# Patient Record
Sex: Female | Born: 1965 | Race: White | Hispanic: No | Marital: Married | Smoking: Former smoker
Health system: Southern US, Community
[De-identification: ages and names within clinical notes are randomized; demographics above are authoritative.]

## PROBLEM LIST (undated history)

## (undated) DIAGNOSIS — M549 Dorsalgia, unspecified: Secondary | ICD-10-CM

## (undated) DIAGNOSIS — IMO0002 Reserved for concepts with insufficient information to code with codable children: Secondary | ICD-10-CM

## (undated) DIAGNOSIS — K910 Vomiting following gastrointestinal surgery: Secondary | ICD-10-CM

## (undated) DIAGNOSIS — M1990 Unspecified osteoarthritis, unspecified site: Secondary | ICD-10-CM

## (undated) DIAGNOSIS — J302 Other seasonal allergic rhinitis: Secondary | ICD-10-CM

## (undated) DIAGNOSIS — K2270 Barrett's esophagus without dysplasia: Secondary | ICD-10-CM

## (undated) DIAGNOSIS — K259 Gastric ulcer, unspecified as acute or chronic, without hemorrhage or perforation: Secondary | ICD-10-CM

## (undated) DIAGNOSIS — N390 Urinary tract infection, site not specified: Secondary | ICD-10-CM

## (undated) DIAGNOSIS — E039 Hypothyroidism, unspecified: Secondary | ICD-10-CM

## (undated) DIAGNOSIS — I10 Essential (primary) hypertension: Secondary | ICD-10-CM

## (undated) DIAGNOSIS — J189 Pneumonia, unspecified organism: Secondary | ICD-10-CM

## (undated) DIAGNOSIS — R51 Headache: Secondary | ICD-10-CM

## (undated) DIAGNOSIS — J40 Bronchitis, not specified as acute or chronic: Secondary | ICD-10-CM

## (undated) DIAGNOSIS — G4730 Sleep apnea, unspecified: Secondary | ICD-10-CM

## (undated) DIAGNOSIS — G8929 Other chronic pain: Secondary | ICD-10-CM

## (undated) HISTORY — DX: Reserved for concepts with insufficient information to code with codable children: IMO0002

## (undated) HISTORY — PX: COLONOSCOPY: SHX174

## (undated) HISTORY — PX: ESOPHAGOGASTRODUODENOSCOPY: SHX1529

---

## 1999-08-18 HISTORY — PX: TUBAL LIGATION: SHX77

## 2000-08-17 HISTORY — PX: CERVICAL FUSION: SHX112

## 2004-08-17 HISTORY — PX: OTHER SURGICAL HISTORY: SHX169

## 2007-08-18 HISTORY — PX: CHOLECYSTECTOMY: SHX55

## 2009-06-14 ENCOUNTER — Inpatient Hospital Stay (HOSPITAL_COMMUNITY): Admission: RE | Admit: 2009-06-14 | Discharge: 2009-06-19 | Payer: Self-pay | Admitting: Neurosurgery

## 2009-06-14 HISTORY — PX: LUMBAR LAMINECTOMY: SHX95

## 2009-07-17 ENCOUNTER — Encounter: Admission: RE | Admit: 2009-07-17 | Discharge: 2009-07-17 | Payer: Self-pay | Admitting: Neurosurgery

## 2009-08-17 DIAGNOSIS — J40 Bronchitis, not specified as acute or chronic: Secondary | ICD-10-CM

## 2009-08-17 HISTORY — DX: Bronchitis, not specified as acute or chronic: J40

## 2009-08-20 ENCOUNTER — Encounter: Admission: RE | Admit: 2009-08-20 | Discharge: 2009-08-20 | Payer: Self-pay | Admitting: Neurosurgery

## 2010-11-20 LAB — DIFFERENTIAL
Eosinophils Absolute: 0.3 10*3/uL (ref 0.0–0.7)
Eosinophils Relative: 3 % (ref 0–5)
Lymphocytes Relative: 23 % (ref 12–46)
Monocytes Absolute: 0.6 10*3/uL (ref 0.1–1.0)
Monocytes Relative: 8 % (ref 3–12)
Neutro Abs: 5.4 10*3/uL (ref 1.7–7.7)

## 2010-11-20 LAB — TYPE AND SCREEN
ABO/RH(D): A POS
Antibody Screen: NEGATIVE

## 2010-11-20 LAB — CBC
Platelets: 396 10*3/uL (ref 150–400)
RDW: 13.6 % (ref 11.5–15.5)

## 2010-11-20 LAB — URINALYSIS, ROUTINE W REFLEX MICROSCOPIC
Glucose, UA: NEGATIVE mg/dL
Hgb urine dipstick: NEGATIVE
Ketones, ur: NEGATIVE mg/dL
Specific Gravity, Urine: 1.01 (ref 1.005–1.030)

## 2010-11-20 LAB — BASIC METABOLIC PANEL
Calcium: 9.4 mg/dL (ref 8.4–10.5)
Creatinine, Ser: 0.86 mg/dL (ref 0.4–1.2)

## 2011-03-11 ENCOUNTER — Emergency Department (HOSPITAL_COMMUNITY)
Admission: EM | Admit: 2011-03-11 | Discharge: 2011-03-11 | Disposition: A | Discharge status: Home / Self Care | Payer: 59 | Attending: Emergency Medicine | Admitting: Emergency Medicine

## 2011-03-11 DIAGNOSIS — G2581 Restless legs syndrome: Secondary | ICD-10-CM | POA: Insufficient documentation

## 2011-03-11 DIAGNOSIS — T426X5A Adverse effect of other antiepileptic and sedative-hypnotic drugs, initial encounter: Secondary | ICD-10-CM | POA: Insufficient documentation

## 2011-03-11 DIAGNOSIS — E039 Hypothyroidism, unspecified: Secondary | ICD-10-CM | POA: Insufficient documentation

## 2011-03-11 DIAGNOSIS — F410 Panic disorder [episodic paroxysmal anxiety] without agoraphobia: Secondary | ICD-10-CM | POA: Insufficient documentation

## 2011-03-11 DIAGNOSIS — I10 Essential (primary) hypertension: Secondary | ICD-10-CM | POA: Insufficient documentation

## 2011-03-11 DIAGNOSIS — M79609 Pain in unspecified limb: Secondary | ICD-10-CM | POA: Insufficient documentation

## 2011-12-02 ENCOUNTER — Emergency Department (HOSPITAL_BASED_OUTPATIENT_CLINIC_OR_DEPARTMENT_OTHER)
Admission: EM | Admit: 2011-12-02 | Discharge: 2011-12-02 | Disposition: A | Discharge status: Home / Self Care | Payer: 59 | Attending: Emergency Medicine | Admitting: Emergency Medicine

## 2011-12-02 DIAGNOSIS — K922 Gastrointestinal hemorrhage, unspecified: Secondary | ICD-10-CM

## 2011-12-02 NOTE — ED Notes (Signed)
Pt reports rectal bleeding "for months", however amount of bleeding has increased significantly over the past two weeks. Pt has a GI MD that she was supposed to see a couple weeks ago, but was unable to attend that appt.

## 2011-12-02 NOTE — ED Notes (Signed)
Pt states she is a patient of Dr Lanae Boast in [place]. States she was told by her doctor "to come to [facility] and be seen for my bleeding because i cant get an appointment in with her sooner and i'm going out of town on Friday. So basically i need a colonoscopy and endoscopy and see if i have fissures and maybe get my hemorrhoids banded while im here too." pt made aware that our facility does not perform endoscopys or band hemorrhoids. Pt stated "oh, well i thought this was a hospital and that i could get all of that done. OK, well i think i should just go to [place] [facility] instead like my doctor said". Pt aware that our facility EDP would be happy to see her and we would do any necessary testing, however pt kindly refused and stated she would rather go to a hospital. Pt alert and oriented. No distress.

## 2011-12-02 NOTE — ED Notes (Signed)
Pt realized at the start of triage that she did not want to be seen. Triage process was not completed.

## 2011-12-24 ENCOUNTER — Encounter (HOSPITAL_COMMUNITY): Payer: Self-pay | Admitting: Pharmacy Technician

## 2011-12-25 ENCOUNTER — Other Ambulatory Visit: Payer: Self-pay | Admitting: Neurosurgery

## 2011-12-28 ENCOUNTER — Inpatient Hospital Stay (HOSPITAL_COMMUNITY): Admission: RE | Admit: 2011-12-28 | Discharge: 2011-12-28 | Payer: 59 | Source: Ambulatory Visit

## 2011-12-28 ENCOUNTER — Encounter (HOSPITAL_COMMUNITY): Payer: Self-pay

## 2011-12-28 HISTORY — DX: Hypothyroidism, unspecified: E03.9

## 2011-12-28 NOTE — Pre-Procedure Instructions (Signed)
21 Sharon Hughes  12/28/2011   Your procedure is scheduled on:  Monday, May 20th.  Report to [facility] at 5:30AM.  Call this number if you have problems the morning of surgery: [phone]   Remember:   Do not eat food:After Midnight.  May have clear liquids: up to 4 Hours before arrival.  (1:30am)  Clear liquids include soda, tea, black coffee, apple or grape juice, broth.   Take these medicines the morning of surgery with A SIP OF WATER:  Cimetidine (Tagamet), Levothyroxine (Synthyroid),  Methadone .  Use Albuterol and bring to the hospital with you.   Do not wear jewelry, make-up or nail polish.  Do not wear lotions, powders, or perfumes. You may wear deodorant.  Do not shave 48 hours prior to surgery. Men may shave face and neck.  Do not bring valuables to the hospital.  Contacts, dentures or bridgework may not be worn into surgery.  Leave suitcase in the car. After surgery it may be brought to your room.  For patients admitted to the hospital, checkout time is 11:00 AM the day of discharge.   Patients discharged the day of surgery will not be allowed to drive home.  Name and phone number of your driver: --  Special Instructions: CHG Shower Use Special Wash: 1/2 bottle night before surgery and 1/2 bottle morning of surgery.   Please read over the following fact sheets that you were given: Pain Booklet, Coughing and Deep Breathing, Blood Transfusion Information, MRSA Information and Surgical Site Infection Prevention

## 2011-12-30 ENCOUNTER — Ambulatory Visit (HOSPITAL_COMMUNITY)
Admission: RE | Admit: 2011-12-30 | Discharge: 2011-12-30 | Disposition: A | Discharge status: Home / Self Care | Payer: 59 | Source: Ambulatory Visit | Attending: Neurosurgery | Admitting: Neurosurgery

## 2011-12-30 ENCOUNTER — Encounter (HOSPITAL_COMMUNITY): Payer: Self-pay

## 2011-12-30 ENCOUNTER — Encounter (HOSPITAL_COMMUNITY)
Admission: RE | Admit: 2011-12-30 | Discharge: 2011-12-30 | Disposition: A | Discharge status: Home / Self Care | Payer: 59 | Source: Ambulatory Visit | Attending: Neurosurgery | Admitting: Neurosurgery

## 2011-12-30 DIAGNOSIS — Z01818 Encounter for other preprocedural examination: Secondary | ICD-10-CM | POA: Insufficient documentation

## 2011-12-30 DIAGNOSIS — I4510 Unspecified right bundle-branch block: Secondary | ICD-10-CM | POA: Insufficient documentation

## 2011-12-30 DIAGNOSIS — Z01812 Encounter for preprocedural laboratory examination: Secondary | ICD-10-CM | POA: Insufficient documentation

## 2011-12-30 DIAGNOSIS — Z01810 Encounter for preprocedural cardiovascular examination: Secondary | ICD-10-CM | POA: Insufficient documentation

## 2011-12-30 DIAGNOSIS — M48061 Spinal stenosis, lumbar region without neurogenic claudication: Secondary | ICD-10-CM | POA: Insufficient documentation

## 2011-12-30 HISTORY — DX: Dorsalgia, unspecified: M54.9

## 2011-12-30 HISTORY — DX: Reserved for concepts with insufficient information to code with codable children: IMO0002

## 2011-12-30 HISTORY — DX: Barrett's esophagus without dysplasia: K22.70

## 2011-12-30 HISTORY — DX: Headache: R51

## 2011-12-30 HISTORY — DX: Bronchitis, not specified as acute or chronic: J40

## 2011-12-30 HISTORY — DX: Essential (primary) hypertension: I10

## 2011-12-30 HISTORY — DX: Unspecified osteoarthritis, unspecified site: M19.90

## 2011-12-30 HISTORY — DX: Other seasonal allergic rhinitis: J30.2

## 2011-12-30 HISTORY — DX: Pneumonia, unspecified organism: J18.9

## 2011-12-30 HISTORY — DX: Urinary tract infection, site not specified: N39.0

## 2011-12-30 HISTORY — DX: Other chronic pain: G89.29

## 2011-12-30 LAB — CBC
HCT: 38.8 % (ref 36.0–46.0)
Hemoglobin: 12.7 g/dL (ref 12.0–15.0)
MCH: 31.8 pg (ref 26.0–34.0)
MCHC: 32.7 g/dL (ref 30.0–36.0)
MCV: 97 fL (ref 78.0–100.0)

## 2011-12-30 LAB — TYPE AND SCREEN

## 2011-12-30 LAB — BASIC METABOLIC PANEL
BUN: 13 mg/dL (ref 6–23)
CO2: 29 mEq/L (ref 19–32)
Chloride: 97 mEq/L (ref 96–112)
Glucose, Bld: 72 mg/dL (ref 70–99)
Potassium: 3.5 mEq/L (ref 3.5–5.1)

## 2011-12-30 LAB — DIFFERENTIAL
Basophils Relative: 0 % (ref 0–1)
Eosinophils Absolute: 0 10*3/uL (ref 0.0–0.7)
Lymphocytes Relative: 29 % (ref 12–46)
Lymphs Abs: 5 10*3/uL — ABNORMAL HIGH (ref 0.7–4.0)
Neutro Abs: 10.9 10*3/uL — ABNORMAL HIGH (ref 1.7–7.7)

## 2011-12-30 LAB — SURGICAL PCR SCREEN: Staphylococcus aureus: NEGATIVE

## 2011-12-30 NOTE — Progress Notes (Signed)
Pt doesn't have a cardiologist  Echo/Stress test done 5+yrs ago was done d/t palpitations  Denies ever having a heart cath  Medical MD is Dr.Kipp Corington with [facility] in [place]

## 2011-12-31 NOTE — Consult Note (Signed)
Anesthesia Chart Review:  Patient is a 46 year old female scheduled for L3-4 XLIF on 01/04/12.  History includes smoking, HTN, PNA, asthma, bronchitis, Barrett's esophagus, anxiety, hypothyroidism, chronic back pain, prior cervial fusion, lumbar laminectomy.  PCP is Dr. Denzil Magnuson with [facility] in [place].  EKG on 12/30/11 showed NSR, possible LAE, incomplete right BBB.  It was not felt significantly changed from her EKG from 06/14/09.    She had a negative CXR on 12/30/11.  Labs noted.  WBC 17.3.  No current steroids are listed on her PAT visit.  She was afebrile at PAT.  No UA was ordered, but CXR was OK.  Her WBC was called to Shanda Bumps at Dr. Lindalou Hose office.  I'll repeat her CBC on arrival.  Additional orders, if any, per Dr. Jordan Likes.  Shonna Chock, PA-C

## 2012-01-03 MED ORDER — CEFAZOLIN SODIUM-DEXTROSE 2-3 GM-% IV SOLR
2.0000 g | INTRAVENOUS | Status: AC
  Administered 2012-01-04: 2 g via INTRAVENOUS
  Filled 2012-01-03: qty 50

## 2012-01-03 MED ORDER — CEFAZOLIN SODIUM 1-5 GM-% IV SOLN: 1.0000 g | INTRAVENOUS | Status: DC

## 2012-01-04 ENCOUNTER — Encounter (HOSPITAL_COMMUNITY): Payer: Self-pay | Admitting: Neurosurgery

## 2012-01-04 ENCOUNTER — Ambulatory Visit (HOSPITAL_COMMUNITY): Payer: 59 | Admitting: Vascular Surgery

## 2012-01-04 ENCOUNTER — Encounter (HOSPITAL_COMMUNITY)
Admission: RE | Disposition: A | Discharge status: Home / Self Care | Payer: Self-pay | Source: Ambulatory Visit | Attending: Neurosurgery

## 2012-01-04 ENCOUNTER — Encounter (HOSPITAL_COMMUNITY): Payer: Self-pay | Admitting: Vascular Surgery

## 2012-01-04 ENCOUNTER — Encounter (HOSPITAL_COMMUNITY): Payer: Self-pay | Admitting: *Deleted

## 2012-01-04 ENCOUNTER — Inpatient Hospital Stay (HOSPITAL_COMMUNITY)
Admission: RE | Admit: 2012-01-04 | Discharge: 2012-01-05 | DRG: 460 | Disposition: A | Discharge status: Home / Self Care | Payer: 59 | Source: Ambulatory Visit | Attending: Neurosurgery | Admitting: Neurosurgery

## 2012-01-04 ENCOUNTER — Ambulatory Visit (HOSPITAL_COMMUNITY): Payer: 59

## 2012-01-04 DIAGNOSIS — J45909 Unspecified asthma, uncomplicated: Secondary | ICD-10-CM | POA: Diagnosis present

## 2012-01-04 DIAGNOSIS — E039 Hypothyroidism, unspecified: Secondary | ICD-10-CM | POA: Diagnosis present

## 2012-01-04 DIAGNOSIS — Z888 Allergy status to other drugs, medicaments and biological substances status: Secondary | ICD-10-CM

## 2012-01-04 DIAGNOSIS — M51379 Other intervertebral disc degeneration, lumbosacral region without mention of lumbar back pain or lower extremity pain: Principal | ICD-10-CM | POA: Diagnosis present

## 2012-01-04 DIAGNOSIS — K5900 Constipation, unspecified: Secondary | ICD-10-CM | POA: Diagnosis present

## 2012-01-04 DIAGNOSIS — M48062 Spinal stenosis, lumbar region with neurogenic claudication: Secondary | ICD-10-CM | POA: Diagnosis present

## 2012-01-04 DIAGNOSIS — Z79899 Other long term (current) drug therapy: Secondary | ICD-10-CM

## 2012-01-04 DIAGNOSIS — M5137 Other intervertebral disc degeneration, lumbosacral region: Principal | ICD-10-CM | POA: Diagnosis present

## 2012-01-04 DIAGNOSIS — Z885 Allergy status to narcotic agent status: Secondary | ICD-10-CM

## 2012-01-04 DIAGNOSIS — F411 Generalized anxiety disorder: Secondary | ICD-10-CM | POA: Diagnosis present

## 2012-01-04 DIAGNOSIS — Z9851 Tubal ligation status: Secondary | ICD-10-CM

## 2012-01-04 DIAGNOSIS — I10 Essential (primary) hypertension: Secondary | ICD-10-CM | POA: Diagnosis present

## 2012-01-04 DIAGNOSIS — G8929 Other chronic pain: Secondary | ICD-10-CM | POA: Diagnosis present

## 2012-01-04 DIAGNOSIS — M47817 Spondylosis without myelopathy or radiculopathy, lumbosacral region: Secondary | ICD-10-CM | POA: Diagnosis present

## 2012-01-04 HISTORY — PX: ANTERIOR LAT LUMBAR FUSION: SHX1168

## 2012-01-04 LAB — CBC
HCT: 42.5 % (ref 36.0–46.0)
Platelets: ADEQUATE 10*3/uL (ref 150–400)
RBC: 3.74 MIL/uL — ABNORMAL LOW (ref 3.87–5.11)
RDW: 20.6 % — ABNORMAL HIGH (ref 11.5–15.5)
WBC: 12 10*3/uL — ABNORMAL HIGH (ref 4.0–10.5)

## 2012-01-04 SURGERY — ANTERIOR LATERAL LUMBAR FUSION 1 LEVEL
Anesthesia: General | Site: Spine Lumbar | Laterality: Left | Wound class: Clean

## 2012-01-04 MED ORDER — BACITRACIN 50000 UNITS IM SOLR
INTRAMUSCULAR | Status: AC
  Filled 2012-01-04: qty 1

## 2012-01-04 MED ORDER — SENNA 8.6 MG PO TABS
1.0000 | ORAL_TABLET | Freq: Two times a day (BID) | ORAL | Status: DC
  Administered 2012-01-04 – 2012-01-05 (×2): 8.6 mg via ORAL
  Filled 2012-01-04 (×3): qty 1

## 2012-01-04 MED ORDER — LEVOTHYROXINE SODIUM 150 MCG PO TABS
150.0000 ug | ORAL_TABLET | Freq: Every day | ORAL | Status: DC
  Administered 2012-01-04 – 2012-01-05 (×2): 150 ug via ORAL
  Filled 2012-01-04 (×2): qty 1

## 2012-01-04 MED ORDER — POLYETHYLENE GLYCOL 3350 17 G PO PACK
17.0000 g | PACK | Freq: Every day | ORAL | Status: DC
  Administered 2012-01-05: 17 g via ORAL
  Filled 2012-01-04: qty 1

## 2012-01-04 MED ORDER — LISINOPRIL-HYDROCHLOROTHIAZIDE 20-12.5 MG PO TABS
1.0000 | ORAL_TABLET | Freq: Every day | ORAL | Status: DC
Start: 2012-01-04 — End: 2012-01-04

## 2012-01-04 MED ORDER — HYDROCHLOROTHIAZIDE 12.5 MG PO CAPS
12.5000 mg | ORAL_CAPSULE | Freq: Every day | ORAL | Status: DC
  Administered 2012-01-05: 12.5 mg via ORAL
  Filled 2012-01-04: qty 1

## 2012-01-04 MED ORDER — PHENOL 1.4 % MT LIQD: 1.0000 | OROMUCOSAL | Status: DC | PRN

## 2012-01-04 MED ORDER — SUCCINYLCHOLINE CHLORIDE 20 MG/ML IJ SOLN
INTRAMUSCULAR | Status: DC | PRN
  Administered 2012-01-04: 100 mg via INTRAVENOUS

## 2012-01-04 MED ORDER — DEXAMETHASONE SODIUM PHOSPHATE 10 MG/ML IJ SOLN: 10.0000 mg | INTRAMUSCULAR | Status: DC

## 2012-01-04 MED ORDER — ACETAMINOPHEN 325 MG PO TABS: 650.0000 mg | ORAL_TABLET | ORAL | Status: DC | PRN

## 2012-01-04 MED ORDER — SODIUM CHLORIDE 0.9 % IV SOLN
INTRAVENOUS | Status: AC
  Filled 2012-01-04: qty 500

## 2012-01-04 MED ORDER — LACTATED RINGERS IV SOLN
INTRAVENOUS | Status: DC | PRN
  Administered 2012-01-04 (×2): via INTRAVENOUS

## 2012-01-04 MED ORDER — CEFAZOLIN SODIUM 1-5 GM-% IV SOLN
1.0000 g | Freq: Three times a day (TID) | INTRAVENOUS | Status: AC
  Administered 2012-01-04 (×2): 1 g via INTRAVENOUS
  Filled 2012-01-04 (×2): qty 50

## 2012-01-04 MED ORDER — SODIUM CHLORIDE 0.9 % IJ SOLN: 3.0000 mL | INTRAMUSCULAR | Status: DC | PRN

## 2012-01-04 MED ORDER — HYDROMORPHONE HCL PF 1 MG/ML IJ SOLN
INTRAMUSCULAR | Status: AC
  Administered 2012-01-04: 0.5 mg
  Filled 2012-01-04: qty 1

## 2012-01-04 MED ORDER — FENTANYL CITRATE 0.05 MG/ML IJ SOLN
INTRAMUSCULAR | Status: DC | PRN
  Administered 2012-01-04: 50 ug via INTRAVENOUS
  Administered 2012-01-04: 150 ug via INTRAVENOUS
  Administered 2012-01-04: 50 ug via INTRAVENOUS

## 2012-01-04 MED ORDER — ALUM & MAG HYDROXIDE-SIMETH 200-200-20 MG/5ML PO SUSP: 30.0000 mL | Freq: Four times a day (QID) | ORAL | Status: DC | PRN

## 2012-01-04 MED ORDER — METOCLOPRAMIDE HCL 5 MG/ML IJ SOLN
10.0000 mg | Freq: Once | INTRAMUSCULAR | Status: DC | PRN
  Filled 2012-01-04: qty 2

## 2012-01-04 MED ORDER — LISINOPRIL 20 MG PO TABS
20.0000 mg | ORAL_TABLET | Freq: Every day | ORAL | Status: DC
  Administered 2012-01-05: 20 mg via ORAL
  Filled 2012-01-04: qty 1

## 2012-01-04 MED ORDER — ACETAMINOPHEN 650 MG RE SUPP: 650.0000 mg | RECTAL | Status: DC | PRN

## 2012-01-04 MED ORDER — BISACODYL 10 MG RE SUPP: 10.0000 mg | Freq: Every day | RECTAL | Status: DC | PRN

## 2012-01-04 MED ORDER — CYCLOBENZAPRINE HCL 10 MG PO TABS
10.0000 mg | ORAL_TABLET | Freq: Three times a day (TID) | ORAL | Status: DC | PRN
  Administered 2012-01-04: 10 mg via ORAL
  Filled 2012-01-04: qty 1

## 2012-01-04 MED ORDER — SODIUM CHLORIDE 0.9 % IJ SOLN
3.0000 mL | Freq: Two times a day (BID) | INTRAMUSCULAR | Status: DC
  Administered 2012-01-04 – 2012-01-05 (×2): 3 mL via INTRAVENOUS

## 2012-01-04 MED ORDER — ONDANSETRON HCL 4 MG/2ML IJ SOLN
INTRAMUSCULAR | Status: DC | PRN
  Administered 2012-01-04: 4 mg via INTRAVENOUS

## 2012-01-04 MED ORDER — HYDROCODONE-ACETAMINOPHEN 5-325 MG PO TABS: 1.0000 | ORAL_TABLET | ORAL | Status: DC | PRN

## 2012-01-04 MED ORDER — POLYETHYLENE GLYCOL 3350 17 G PO PACK
17.0000 g | PACK | Freq: Every day | ORAL | Status: DC | PRN
  Filled 2012-01-04: qty 1

## 2012-01-04 MED ORDER — ONDANSETRON HCL 4 MG/2ML IJ SOLN: 4.0000 mg | INTRAMUSCULAR | Status: DC | PRN

## 2012-01-04 MED ORDER — SODIUM CHLORIDE 0.9 % IR SOLN
Status: DC | PRN
  Administered 2012-01-04: 07:00:00

## 2012-01-04 MED ORDER — FLEET ENEMA 7-19 GM/118ML RE ENEM
1.0000 | ENEMA | Freq: Once | RECTAL | Status: AC | PRN
  Filled 2012-01-04: qty 1

## 2012-01-04 MED ORDER — ZOLPIDEM TARTRATE 5 MG PO TABS: 10.0000 mg | ORAL_TABLET | Freq: Every evening | ORAL | Status: DC | PRN

## 2012-01-04 MED ORDER — FAMOTIDINE 20 MG PO TABS
20.0000 mg | ORAL_TABLET | Freq: Two times a day (BID) | ORAL | Status: DC
  Administered 2012-01-04 – 2012-01-05 (×2): 20 mg via ORAL
  Filled 2012-01-04 (×3): qty 1

## 2012-01-04 MED ORDER — DEXAMETHASONE SODIUM PHOSPHATE 10 MG/ML IJ SOLN
INTRAMUSCULAR | Status: AC
  Filled 2012-01-04: qty 1

## 2012-01-04 MED ORDER — METHADONE HCL 10 MG/ML PO CONC: 220.0000 mg | Freq: Every day | ORAL | Status: DC

## 2012-01-04 MED ORDER — HYDROMORPHONE HCL PF 1 MG/ML IJ SOLN
0.5000 mg | INTRAMUSCULAR | Status: DC | PRN
  Administered 2012-01-04 – 2012-01-05 (×6): 1 mg via INTRAVENOUS
  Filled 2012-01-04 (×6): qty 1

## 2012-01-04 MED ORDER — DIAZEPAM 5 MG PO TABS
5.0000 mg | ORAL_TABLET | Freq: Four times a day (QID) | ORAL | Status: DC | PRN
  Administered 2012-01-04: 5 mg via ORAL
  Administered 2012-01-05: 10 mg via ORAL
  Filled 2012-01-04: qty 1
  Filled 2012-01-04: qty 2

## 2012-01-04 MED ORDER — METHADONE HCL 5 MG PO TABS: 220.0000 mg | ORAL_TABLET | Freq: Every day | ORAL | Status: DC

## 2012-01-04 MED ORDER — ALBUTEROL SULFATE HFA 108 (90 BASE) MCG/ACT IN AERS
2.0000 | INHALATION_SPRAY | RESPIRATORY_TRACT | Status: DC | PRN
  Filled 2012-01-04: qty 6.7

## 2012-01-04 MED ORDER — 0.9 % SODIUM CHLORIDE (POUR BTL) OPTIME
TOPICAL | Status: DC | PRN
  Administered 2012-01-04: 1000 mL

## 2012-01-04 MED ORDER — CLONAZEPAM 0.5 MG PO TABS
1.0000 mg | ORAL_TABLET | Freq: Two times a day (BID) | ORAL | Status: DC | PRN
  Administered 2012-01-04 (×2): 1 mg via ORAL
  Filled 2012-01-04 (×2): qty 2

## 2012-01-04 MED ORDER — MEPERIDINE HCL 25 MG/ML IJ SOLN: 6.2500 mg | INTRAMUSCULAR | Status: DC | PRN

## 2012-01-04 MED ORDER — MIDAZOLAM HCL 5 MG/5ML IJ SOLN
INTRAMUSCULAR | Status: DC | PRN
  Administered 2012-01-04: 2 mg via INTRAVENOUS

## 2012-01-04 MED ORDER — HYDROMORPHONE HCL PF 1 MG/ML IJ SOLN
0.2500 mg | INTRAMUSCULAR | Status: DC | PRN
  Administered 2012-01-04 (×3): 0.5 mg via INTRAVENOUS

## 2012-01-04 MED ORDER — PROPOFOL 10 MG/ML IV EMUL
INTRAVENOUS | Status: DC | PRN
  Administered 2012-01-04: 200 mg via INTRAVENOUS

## 2012-01-04 MED ORDER — LIDOCAINE HCL (CARDIAC) 20 MG/ML IV SOLN
INTRAVENOUS | Status: DC | PRN
  Administered 2012-01-04: 80 mg via INTRAVENOUS

## 2012-01-04 MED ORDER — OXYCODONE-ACETAMINOPHEN 5-325 MG PO TABS
1.0000 | ORAL_TABLET | ORAL | Status: DC | PRN
  Administered 2012-01-04 – 2012-01-05 (×5): 2 via ORAL
  Filled 2012-01-04 (×6): qty 2

## 2012-01-04 MED ORDER — HYDROMORPHONE HCL PF 1 MG/ML IJ SOLN
INTRAMUSCULAR | Status: AC
  Filled 2012-01-04: qty 1

## 2012-01-04 MED ORDER — MENTHOL 3 MG MT LOZG
1.0000 | LOZENGE | OROMUCOSAL | Status: DC | PRN
  Filled 2012-01-04: qty 9

## 2012-01-04 MED ORDER — PHENYLEPHRINE HCL 10 MG/ML IJ SOLN
INTRAMUSCULAR | Status: DC | PRN
  Administered 2012-01-04: 40 ug via INTRAVENOUS

## 2012-01-04 MED ORDER — DEXAMETHASONE SODIUM PHOSPHATE 4 MG/ML IJ SOLN
INTRAMUSCULAR | Status: DC | PRN
  Administered 2012-01-04: 10 mg via INTRAVENOUS

## 2012-01-04 SURGICAL SUPPLY — 59 items
BAG DECANTER FOR FLEXI CONT (MISCELLANEOUS) ×2 IMPLANT
BENZOIN TINCTURE PRP APPL 2/3 (GAUZE/BANDAGES/DRESSINGS) ×2 IMPLANT
BLADE SURG ROTATE 9660 (MISCELLANEOUS) IMPLANT
BOLT 5.5X45MM (Bolt) ×2 IMPLANT
BOLT XLP 5.5X50MM (Bolt) ×2 IMPLANT
BONE MATRIX OSTEOCEL PLUS 5CC (Bone Implant) ×2 IMPLANT
CLOTH BEACON ORANGE TIMEOUT ST (SAFETY) ×2 IMPLANT
CONT SPEC 4OZ CLIKSEAL STRL BL (MISCELLANEOUS) IMPLANT
COROENT XL WIDE LORDO 10X22X45 (Intraocular Lens) ×2 IMPLANT
COVER BACK TABLE 24X17X13 BIG (DRAPES) ×2 IMPLANT
DERMABOND ADVANCED (GAUZE/BANDAGES/DRESSINGS) ×3
DERMABOND ADVANCED .7 DNX12 (GAUZE/BANDAGES/DRESSINGS) ×3 IMPLANT
DRAPE C-ARM 42X72 X-RAY (DRAPES) ×2 IMPLANT
DRAPE C-ARMOR (DRAPES) ×2 IMPLANT
DRAPE LAPAROTOMY 100X72X124 (DRAPES) ×2 IMPLANT
DRAPE POUCH INSTRU U-SHP 10X18 (DRAPES) ×2 IMPLANT
DRAPE SURG 17X23 STRL (DRAPES) ×4 IMPLANT
ELECT REM PT RETURN 9FT ADLT (ELECTROSURGICAL) ×2
ELECTRODE REM PT RTRN 9FT ADLT (ELECTROSURGICAL) ×1 IMPLANT
GAUZE SPONGE 4X4 16PLY XRAY LF (GAUZE/BANDAGES/DRESSINGS) IMPLANT
GLOVE BIO SURGEON STRL SZ8 (GLOVE) ×2 IMPLANT
GLOVE BIOGEL PI IND STRL 6.5 (GLOVE) ×2 IMPLANT
GLOVE BIOGEL PI INDICATOR 6.5 (GLOVE) ×2
GLOVE ECLIPSE 8.5 STRL (GLOVE) ×2 IMPLANT
GLOVE EXAM NITRILE LRG STRL (GLOVE) IMPLANT
GLOVE EXAM NITRILE MD LF STRL (GLOVE) ×2 IMPLANT
GLOVE EXAM NITRILE XL STR (GLOVE) IMPLANT
GLOVE EXAM NITRILE XS STR PU (GLOVE) IMPLANT
GLOVE INDICATOR 7.0 STRL GRN (GLOVE) ×2 IMPLANT
GLOVE INDICATOR 8.5 STRL (GLOVE) ×2 IMPLANT
GLOVE OPTIFIT SS 6.5 STRL BRWN (GLOVE) ×2 IMPLANT
GOWN BRE IMP SLV AUR LG STRL (GOWN DISPOSABLE) ×2 IMPLANT
GOWN BRE IMP SLV AUR XL STRL (GOWN DISPOSABLE) ×4 IMPLANT
GOWN STRL REIN 2XL LVL4 (GOWN DISPOSABLE) IMPLANT
KIT BASIN OR (CUSTOM PROCEDURE TRAY) ×2 IMPLANT
KIT DILATOR XLIF 5 (KITS) ×1 IMPLANT
KIT MAXCESS (KITS) ×2 IMPLANT
KIT NEEDLE NVM5 EMG ELECT (KITS) ×1 IMPLANT
KIT NEEDLE NVM5 EMG ELECTRODE (KITS) ×1
KIT ROOM TURNOVER OR (KITS) ×2 IMPLANT
KIT XLIF (KITS) ×1
NEEDLE HYPO 22GX1.5 SAFETY (NEEDLE) ×2 IMPLANT
NS IRRIG 1000ML POUR BTL (IV SOLUTION) ×2 IMPLANT
NUT LOCK NUVASIVE (Orthopedic Implant) ×4 IMPLANT
PACK LAMINECTOMY NEURO (CUSTOM PROCEDURE TRAY) ×2 IMPLANT
PLATE NUVASIVE XLP SZ8 (Plate) ×2 IMPLANT
PUTTY 5ML ACTIFUSE ABX (Putty) ×2 IMPLANT
SPONGE GAUZE 4X4 12PLY (GAUZE/BANDAGES/DRESSINGS) ×2 IMPLANT
SPONGE LAP 4X18 X RAY DECT (DISPOSABLE) IMPLANT
STRIP CLOSURE SKIN 1/2X4 (GAUZE/BANDAGES/DRESSINGS) ×2 IMPLANT
SUT VIC AB 2-0 CT1 18 (SUTURE) ×4 IMPLANT
SUT VIC AB 3-0 SH 8-18 (SUTURE) ×4 IMPLANT
SYR 20ML ECCENTRIC (SYRINGE) ×2 IMPLANT
TAPE CLOTH 3X10 TAN LF (GAUZE/BANDAGES/DRESSINGS) ×6 IMPLANT
TAPE CLOTH SURG 4X10 WHT LF (GAUZE/BANDAGES/DRESSINGS) ×2 IMPLANT
TOWEL OR 17X24 6PK STRL BLUE (TOWEL DISPOSABLE) ×2 IMPLANT
TOWEL OR 17X26 10 PK STRL BLUE (TOWEL DISPOSABLE) ×2 IMPLANT
TRAY FOLEY CATH 14FRSI W/METER (CATHETERS) ×2 IMPLANT
WATER STERILE IRR 1000ML POUR (IV SOLUTION) ×2 IMPLANT

## 2012-01-04 NOTE — Progress Notes (Signed)
OT Cancellation Note  Orders received for OT consult, pt currently sleeping soundly after just receiving pain meds.  Will check back tomorrow in the am to complete OT evaluation.  Perrin Maltese, OTR/L Pager number [phone] 01/04/2012

## 2012-01-04 NOTE — Anesthesia Postprocedure Evaluation (Signed)
  Anesthesia Post-op Note  Patient: Sharon Hughes  Procedure(s) Performed: Procedure(s) (LRB): ANTERIOR LATERAL LUMBAR FUSION 1 LEVEL (Left)  Patient Location: PACU  Anesthesia Type: General  Level of Consciousness: awake, alert  and oriented  Airway and Oxygen Therapy: Patient Spontanous Breathing  Post-op Pain: mild  Post-op Assessment: Post-op Vital signs reviewed, Patient's Cardiovascular Status Stable, Respiratory Function Stable, Patent Airway, No signs of Nausea or vomiting and Pain level controlled  Post-op Vital Signs: Reviewed and stable  Complications: No apparent anesthesia complications

## 2012-01-04 NOTE — Anesthesia Procedure Notes (Signed)
Procedure Name: Intubation Date/Time: 01/04/2012 7:58 AM Performed by: Ellin Goodie Pre-anesthesia Checklist: Patient identified, Emergency Drugs available, Suction available, Patient being monitored and Timeout performed Patient Re-evaluated:Patient Re-evaluated prior to inductionOxygen Delivery Method: Circle system utilized Preoxygenation: Pre-oxygenation with 100% oxygen Intubation Type: IV induction Ventilation: Mask ventilation without difficulty Laryngoscope Size: Mac and 3 Grade View: Grade I Tube type: Oral Tube size: 7.5 mm Number of attempts: 1 Airway Equipment and Method: Stylet Placement Confirmation: ETT inserted through vocal cords under direct vision,  positive ETCO2 and breath sounds checked- equal and bilateral Secured at: 21 cm Tube secured with: Tape Dental Injury: Teeth and Oropharynx as per pre-operative assessment

## 2012-01-04 NOTE — OR Nursing (Signed)
NUVASIVE NEEDLE ELECTRODES APPLIED FOR NERVE MONITORING

## 2012-01-04 NOTE — Transfer of Care (Signed)
Immediate Anesthesia Transfer of Care Note  Patient: Sharon Hughes  Procedure(s) Performed: Procedure(s) (LRB): ANTERIOR LATERAL LUMBAR FUSION 1 LEVEL (Left)  Patient Location: PACU  Anesthesia Type: General  Level of Consciousness: awake, alert  and oriented  Airway & Oxygen Therapy: Patient Spontanous Breathing  Post-op Assessment: Report given to PACU RN  Post vital signs: stable  Complications: No apparent anesthesia complications

## 2012-01-04 NOTE — Op Note (Signed)
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  Surgeon:Jonette Wassel A.Jerran Tappan, M.D.  Asst. Surgeon: Wynetta Emery  Anesthesia: General  Indication: Patient is a 46 year old female status post previous L4-5 decompression and fusion with good results. Patient presents now with worsening back and right lower extremity pain failing conservative management her workup demonstrates evidence of marked disc degeneration with instability at L3-4. Fusion appears solid at L4-5. Patient presents now for anterolateral decompression infusion in hopes of improving her symptoms.  Operative note: After induction of anesthesia. Patient positioned in the right lateral decubitus position and appropriately padded. Left flank was prepped and draped sterilely. X-ray was used for localization and incision was made overlying the L3-4 interspace. A secondary incision was made in the left posterior flank and access to the retroperitoneal space was gained in a blunt manner. A dilator was then passed through the more anterior incision and passed into the retroperitoneal space and told locking with the psoas muscle. Psoas muscle was divided and the dilator was.into the L3-4 disc space under fluoroscopic guidance. Intraoperative neural monitoring was used to confirm safe passage anterior to the lumbar plexus. The dilator was further expanded and a self-retaining retractor was placed. Once again all steps were performed using real-time neural monitoring. Retractor was expanded and the disc space visualized. The rec stimulation of the disc space and lateral  vertebral bodies of L3 and L4 was performed. Once again it was confirmed that there were no traversing nerve structures. I shim was then placed into the posterior disc space of L3-4. Aggressive discectomy was then performed using 15 blade and various curettes and gouges. A contralateral least was performed. The space was subsequently sized to a 10 mm lordotic graft. A 10 mm x 45 mm x 22 mm lordotic peek cage packed with morselized allograft was then impacted in place under fluoroscopic guidance. Good position was confirmed in both the lateral and AP plane. Lateral plate instrumentation was then placed under fluoroscopic guidance using bicortical single screws. Once the once the bolts were placed. A 8 mm plate was then placed over the bolt heads. Locking caps were then placed over the bolt heads and then tightened securely. Final images revealed good position the bone graft and hardware at the proper operative level with normal alignment of the spine. The wounds were then irrigated with and bike solution. They were closed in a typical fashion. Steri-Strips and sterile dressing were applied. There were no apparent complications.

## 2012-01-04 NOTE — Anesthesia Preprocedure Evaluation (Addendum)
Anesthesia Evaluation  Patient identified by MRN, date of birth, ID band Patient awake    Reviewed: Allergy & Precautions, H&P , NPO status , Patient's Chart, lab work & pertinent test results, reviewed documented beta blocker date and time   Airway Mallampati: I TM Distance: <3 FB     Dental  (+) Teeth Intact   Pulmonary asthma , pneumonia ,  breath sounds clear to auscultation        Cardiovascular hypertension, Pt. on medications Rhythm:Regular Rate:Normal     Neuro/Psych  Headaches, Anxiety    GI/Hepatic negative GI ROS, Neg liver ROS,   Endo/Other  Hypothyroidism   Renal/GU negative Renal ROS  negative genitourinary   Musculoskeletal negative musculoskeletal ROS (+)   Abdominal (+)  Abdomen: soft. Bowel sounds: normal.  Peds  Hematology negative hematology ROS (+)   Anesthesia Other Findings   Reproductive/Obstetrics negative OB ROS                         Anesthesia Physical Anesthesia Plan  ASA: II  Anesthesia Plan: General   Post-op Pain Management:    Induction: Intravenous  Airway Management Planned: Oral ETT  Additional Equipment:   Intra-op Plan:   Post-operative Plan: Extubation in OR  Informed Consent: I have reviewed the patients History and Physical, chart, labs and discussed the procedure including the risks, benefits and alternatives for the proposed anesthesia with the patient or authorized representative who has indicated his/her understanding and acceptance.   Dental advisory given  Plan Discussed with: CRNA, Anesthesiologist and Surgeon  Anesthesia Plan Comments:         Anesthesia Quick Evaluation

## 2012-01-04 NOTE — Progress Notes (Signed)
UR COMPLETED  

## 2012-01-04 NOTE — H&P (Signed)
Sharon Hughes is an 46 y.o. female.   Chief Complaint: Back pain HPI: [name] with severe back pain with some radiation into her right lower extremity. Patient status post previous L4-5 decompression and fusion. Patient with marked disc space breakdown and evidence of instability at the L3-4 level without evidence of significant stenosis. Patient presents now for L3-4 anterior lateral retroperitoneal fusion in hopes of improving her symptoms.  Past Medical History  Diagnosis Date  . Hypertension     takes Lisinopril prn  . Asthma     Advair daily and Albuterol prn  . Pneumonia     hx of--2009  . Bronchitis 2011  . Seasonal allergies   . Headache     occasionally  . Chronic back pain     degenerative disc and stenosis  . Arthritis   . Eczema     hx of--no problems in 28yrs   . Barrett's esophagus     takes Tagamet daily  . Ulcer     unsure if gastric/peptic/duodenal but cleared up with carafate  . 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 Hemorrhoids     banded  . UTI (lower urinary tract infection)     finished up antibiotics last week   . Hypothyroidism     takes Levothyroxine daily  . Anxiety     takes Klonopin prn    Past Surgical History  Procedure Date  . Lumbar laminectomy 06/14/2009  . Cervical fusion   . Cholecystectomy   . Cesarean section     x 3  . Tubal ligation     with one of the c/s  . Tubal reversed   . Esophagogastroduodenoscopy   . Colonoscopy     Family History  Problem Relation Age of Onset  . Anesthesia problems Neg Hx   . Hypotension Neg Hx   . Malignant hyperthermia Neg Hx   . Pseudochol deficiency Neg Hx    Social History:  reports that she has been smoking Cigarettes.  She has a .75 pack-year smoking history. She does not have any smokeless tobacco history on file. She reports that she does not drink alcohol or use illicit drugs.  Allergies:  Allergies  Allergen Reactions  . Ativan  (Lorazepam) Other (See Comments)    Aggravates restless leg syndrome  . Other Other (See Comments)    Melons cause bumps and swelling of the throat  . Toradol (Ketorolac Tromethamine) Other (See Comments)    Aggravates restless leg syndrome  . Vistaril (Hydroxyzine Hcl) Other (See Comments)    Aggravates restless leg syndrome    Medications Prior to Admission  Medication Sig Dispense Refill  . albuterol (PROVENTIL HFA;VENTOLIN HFA) 108 (90 BASE) MCG/ACT inhaler Inhale 2 puffs into the lungs every 4 (four) hours as needed. For shortness of breath      . Cimetidine (TAGAMET PO) Take 1 tablet by mouth daily as needed. For stomach acid      . clonazePAM (KLONOPIN) 1 MG tablet Take 1 mg by mouth 2 (two) times daily as needed.      Marland Kitchen levothyroxine (SYNTHROID, LEVOTHROID) 150 MCG tablet Take 150 mcg by mouth daily.      Marland Kitchen lisinopril-hydrochlorothiazide (PRINZIDE,ZESTORETIC) 20-12.5 MG per tablet Take 1 tablet by mouth daily.      . methadone (DOLOPHINE) 10 MG/ML solution Take 220 mg by mouth daily.      . polyethylene glycol (MIRALAX / GLYCOLAX) packet Take 17 g by  mouth daily.        Results for orders placed during the hospital encounter of 01/04/12 (from the past 48 hour(s))  CBC     Status: Abnormal   Collection Time   01/04/12  6:40 AM      Component Value Range Comment   WBC 12.0 (*) 4.0 - 10.5 (K/uL)    RBC 3.74 (*) 3.87 - 5.11 (MIL/uL)    Hemoglobin 12.2  12.0 - 15.0 (g/dL)    HCT 96.0  45.4 - 09.8 (%)    MCV 113.6 (*) 78.0 - 100.0 (fL)    MCH 32.6  26.0 - 34.0 (pg)    MCHC 28.7 (*) 30.0 - 36.0 (g/dL)    RDW 11.9 (*) 14.7 - 15.5 (%)    Platelets    150 - 400 (K/uL)    Value: PLATELET CLUMPS NOTED ON SMEAR, COUNT APPEARS ADEQUATE   No results found.  Review of Systems  Constitutional: Negative.   HENT: Negative.   Eyes: Negative.   Respiratory: Negative.   Cardiovascular: Negative.   Gastrointestinal: Negative.   Genitourinary: Negative.   Musculoskeletal: Negative.     Skin: Negative.   Neurological: Negative.   Endo/Heme/Allergies: Negative.   Psychiatric/Behavioral: Negative.     Blood pressure 101/66, pulse 98, temperature 98.7 F (37.1 C), temperature source Oral, resp. rate 20, last menstrual period 10/02/2011, SpO2 97.00%. Physical Exam  Constitutional: She is oriented to person, place, and time. She appears well-developed and well-nourished.  HENT:  Head: Normocephalic and atraumatic.  Right Ear: External ear normal.  Left Ear: External ear normal.  Nose: Nose normal.  Mouth/Throat: Oropharynx is clear and moist.  Eyes: Conjunctivae and EOM are normal. Pupils are equal, round, and reactive to light.  Neck: Normal range of motion. Neck supple. No tracheal deviation present. No thyromegaly present.  Cardiovascular: Normal rate, regular rhythm, normal heart sounds and intact distal pulses.   Respiratory: Effort normal and breath sounds normal. No respiratory distress. She has no wheezes.  GI: Soft. Bowel sounds are normal. She exhibits no distension. There is no tenderness.  Musculoskeletal: Normal range of motion. She exhibits no edema and no tenderness.  Neurological: She is alert and oriented to person, place, and time. She has normal reflexes. No cranial nerve deficit. Coordination normal.  Skin: Skin is warm and dry.  Psychiatric: She has a normal mood and affect. Her behavior is normal. Judgment and thought content normal.     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 Status post previous L4-5 decompression and fusion. Plan left L3-4 retroperitoneal anterolateral interbody decompression and fusion with peek interbody cage morselized allograft and lateral plate augmentation. Risks and benefits of explained. Patient wishes to proceed.  Sharon Hughes A 01/04/2012, 7:44 AM

## 2012-01-04 NOTE — Discharge Instructions (Signed)
Wound Care Keep incision covered and dry for one week.  If you shower prior to then, cover incision with plastic wrap.  You may remove outer bandage after one week and shower.  Do not put any creams, lotions, or ointments on incision. Leave steri-strips on back.  They will fall off by themselves. Activity Walk each and every day, increasing distance each day. No lifting greater than 5 lbs.  Avoid excessive neck motion. No driving for 2 weeks; may ride as a passenger locally. If provided with back brace, wear when out of bed.  It is not necessary to wear brace in bed. Diet Resume your normal diet.  Return to Work Will be discussed at you follow up appointment. Call Your Doctor If Any of These Occur Redness, drainage, or swelling at the wound.  Temperature greater than 101 degrees. Severe pain not relieved by pain medication. Incision starts to come apart. Follow Up Appt Call today for appointment in 1-2 weeks ([phone]) or for problems.  If you have any hardware placed in your spine, you will need an x-ray before your appointment. 

## 2012-01-04 NOTE — Preoperative (Signed)
Beta Blockers   Reason not to administer Beta Blockers:Not Applicable 

## 2012-01-04 NOTE — Brief Op Note (Signed)
01/04/2012  9:47 AM  PATIENT:  Sharon Hughes  46 y.o. female  PRE-OPERATIVE DIAGNOSIS:  stenosis  POST-OPERATIVE DIAGNOSIS:  stenosis  PROCEDURE:  Procedure(s) (LRB): ANTERIOR LATERAL LUMBAR FUSION 1 LEVEL (Left)  SURGEON:  Surgeon(s) and Role:    * [name], MD - Primary    * Mariam Dollar, MD - Assisting  PHYSICIAN ASSISTANT:   ASSISTANTS: Cram   ANESTHESIA:   general  EBL:  Total I/O In: 1600 [I.V.:1600] Out: 200 [Urine:150; Blood:50]  BLOOD ADMINISTERED:none  DRAINS: none   LOCAL MEDICATIONS USED:  MARCAINE     SPECIMEN:  No Specimen  DISPOSITION OF SPECIMEN:  N/A  COUNTS:  YES  TOURNIQUET:  * No tourniquets in log *  DICTATION: .Dragon Dictation  PLAN OF CARE: Admit to inpatient   PATIENT DISPOSITION:  PACU - hemodynamically stable.   Delay start of Pharmacological VTE agent (>24hrs) due to surgical blood loss or risk of bleeding: yes

## 2012-01-05 ENCOUNTER — Encounter (HOSPITAL_COMMUNITY): Payer: Self-pay | Admitting: Neurosurgery

## 2012-01-05 MED ORDER — DIAZEPAM 5 MG PO TABS: 5.0000 mg | ORAL_TABLET | Freq: Four times a day (QID) | ORAL | Status: AC | PRN

## 2012-01-05 MED ORDER — HYDROMORPHONE HCL 2 MG PO TABS: 2.0000 mg | ORAL_TABLET | ORAL | Status: AC | PRN

## 2012-01-05 NOTE — Evaluation (Signed)
Physical Therapy Evaluation Patient Details Name: Sharon Hughes MRN: [id number] DOB: 01-26-1966 Today's Date: 01/05/2012 Time: 4098-1191 PT Time Calculation (min): 25 min  PT Assessment / Plan / Recommendation Clinical Impression  Pt s/p decompression and fusion of 1 level, with prior history of the same at different level 2 years ago. At time of eval, pt present with decreased activity tolerance and acute pain, needing safety cues for back precautions and RW management. Pt will do well at home with 24hr assist from spouse. Reviewed back precautions and reinforced handout. Pt will need wheel attachment for her standard RW for safe DC home. No follow-up needed at this point. PT to follow acutely to continue safe mobility training, gait training with RW, and reinforce back precations.     PT Assessment  Patient needs continued PT services    Follow Up Recommendations  No PT follow up;Supervision/Assistance - 24 hour    Barriers to Discharge None      lEquipment Recommendations  Other (comment) (RW wheel attachment, or RW if unable to acquire wheels only)    Recommendations for Other Services   None  Frequency Min 5X/week    Precautions / Restrictions Precautions Precautions: Back Precaution Booklet Issued:  (OT gave this morning) Precaution Comments: Pt needs cues for recall and following back precatuions Required Braces or Orthoses: Spinal Brace Spinal Brace: Lumbar corset Restrictions Weight Bearing Restrictions: No   Pertinent Vitals/Pain Pt given pain meds and somewhat groggy at eval.       Mobility  Bed Mobility Bed Mobility: Right Sidelying to Sit;Sit to Sidelying Right Right Sidelying to Sit: 5: Supervision;HOB flat Sit to Sidelying Right: 4: Min guard;HOB flat Details for Bed Mobility Assistance: Cues for timing and pillow between knees sleeping on side Transfers Transfers: Sit to Stand;Stand to Sit Sit to Stand: 4: Min guard;With upper extremity assist;From  bed;From toilet Stand to Sit: 4: Min guard;With upper extremity assist;To bed;To toilet Details for Transfer Assistance: Pt needing cues for safe hand placement surface<>RW as well as keeping RW with pt during turns as she tends to leave it behind Ambulation/Gait Ambulation/Gait Assistance: 5: Supervision Ambulation Distance (Feet): 50 Feet (Pt was recieved while up walking in hall) Assistive device: Rolling walker Ambulation/Gait Assistance Details: Cues for keeping RW on ground during turn and for upright posture.  Gait Pattern: Within Functional Limits Stairs: No Wheelchair Mobility Wheelchair Mobility: No         PT Diagnosis: Difficulty walking;Generalized weakness;Acute pain  PT Problem List: Decreased strength;Decreased activity tolerance;Decreased balance;Decreased mobility;Decreased knowledge of use of DME;Decreased safety awareness;Decreased knowledge of precautions;Decreased skin integrity;Pain PT Treatment Interventions: DME instruction;Gait training;Stair training;Functional mobility training;Therapeutic activities;Therapeutic exercise;Balance training;Neuromuscular re-education;Patient/family education   PT Goals Acute Rehab PT Goals PT Goal Formulation: With patient/family Time For Goal Achievement: 01/12/12 Potential to Achieve Goals: Good Pt will Roll Supine to Right Side: with modified independence PT Goal: Rolling Supine to Right Side - Progress: Goal set today Pt will go Supine/Side to Sit: with modified independence;with HOB 0 degrees PT Goal: Supine/Side to Sit - Progress: Goal set today Pt will go Sit to Supine/Side: with modified independence;with HOB 0 degrees PT Goal: Sit to Supine/Side - Progress: Goal set today Pt will go Sit to Stand: with supervision;with upper extremity assist PT Goal: Sit to Stand - Progress: Goal set today Pt will go Stand to Sit: with supervision;with upper extremity assist PT Goal: Stand to Sit - Progress: Goal set today Pt will  Ambulate: >150 feet;with modified independence;with rolling  walker PT Goal: Ambulate - Progress: Goal set today Additional Goals Additional Goal #1: Pt will verablize and adhere to 3/3 back precautions without cues PT Goal: Additional Goal #1 - Progress: Goal set today  Visit Information  Last PT Received On: 01/05/12 Assistance Needed: +1    Subjective Data  Subjective: This is much better than last time Patient Stated Goal: Go home with spouse   Prior Functioning  Home Living Lives With: Spouse Available Help at Discharge: Available 24 hours/day Type of Home: House Home Access: Stairs to enter [employer] of Steps: 4 steps,     However there is a level entrance they might use as well if needed. Entrance Stairs-Rails: None Home Layout: Two level;Full bath on main level;Able to live on main level with bedroom/bathroom Bathroom Shower/Tub: Health visitor: Standard Bathroom Accessibility: Yes Home Adaptive Equipment: Environmental consultant - standard Prior Function Level of Independence: Independent Able to Take Stairs?: Yes Driving: Yes Communication Communication: No difficulties Dominant Hand: Right    Cognition  Overall Cognitive Status: Appears within functional limits for tasks assessed/performed Arousal/Alertness: Other (comment) (Pt groggy from pain meds, eyes closing during eval) Orientation Level: Appears intact for tasks assessed Behavior During Session: [facility] for tasks performed    Extremity/Trunk Assessment Right Upper Extremity Assessment RUE ROM/Strength/Tone: Unable to fully assess;Within functional levels;Due to precautions [facility] for AROM, strength not tested secondary to precautions.) RUE Sensation: WFL - Light Touch RUE Coordination: WFL - gross/fine motor Left Upper Extremity Assessment LUE ROM/Strength/Tone: Within functional levels;Unable to fully assess;Due to precautions LUE Sensation: WFL - Light Touch LUE Coordination: WFL - gross/fine  motor Right Lower Extremity Assessment RLE ROM/Strength/Tone: Unable to fully assess;Due to precautions;WFL for tasks assessed RLE Sensation: WFL - Light Touch RLE Coordination: WFL - gross/fine motor Left Lower Extremity Assessment LLE ROM/Strength/Tone: Unable to fully assess;Due to precautions;Within functional levels LLE Sensation: WFL - Light Touch LLE Coordination: WFL - gross/fine motor Trunk Assessment Trunk Assessment: Normal   Balance Balance Balance Assessed: Yes Dynamic Sitting Balance Dynamic Sitting - Balance Support: No upper extremity supported;Feet supported Dynamic Sitting - Level of Assistance: 5: Stand by assistance Dynamic Sitting - Balance Activities: Reaching for objects Static Standing Balance Static Standing - Balance Support: No upper extremity supported;During functional activity;Left upper extremity supported;Right upper extremity supported Static Standing - Level of Assistance: 5: Stand by assistance Dynamic Standing Balance Dynamic Standing - Balance Support: Bilateral upper extremity supported Dynamic Standing - Level of Assistance: 5: Stand by assistance  End of Session PT - End of Session Equipment Utilized During Treatment: Gait belt;Back brace Activity Tolerance: Patient tolerated treatment well;Patient limited by fatigue Patient left: in bed;with call bell/phone within reach;with family/visitor present Nurse Communication: Mobility status   Virl Cagey, PT 01/05/2012, 10:29 AM

## 2012-01-05 NOTE — Evaluation (Signed)
Occupational Therapy Evaluation Patient Details Name: Sharon Hughes MRN: [id number] DOB: [birth year] Today's Date: 01/05/2012 Time: 4098-1191 OT Time Calculation (min): 29 min  OT Assessment / Plan / Recommendation Clinical Impression  Pleasant 46 yr old female admaiited for lumbar fusion and decompression of 1 level.  Currently presents with increased pain and decreased overall independence with basic selfcare tasks.  Feel she will benefit from acute OT to help increase knowledge of ADL function while adhering to back precautions.  Feel she will be able to reach supervision level for discharge home with husband who can provide initial 24 hour supervision.      OT Assessment  Patient needs continued OT Services    Follow Up Recommendations  No OT follow up       Equipment Recommendations  3 in 1 bedside comode (Pt has SW but not RW, nay need wheel attachments.)       Frequency  Min 2X/week    Precautions / Restrictions Precautions Precautions: Back Required Braces or Orthoses: Spinal Brace Spinal Brace: Lumbar corset Restrictions Weight Bearing Restrictions: No       ADL  Eating/Feeding: Performed;Independent Where Assessed - Eating/Feeding: Chair Grooming: Performed;Supervision/safety Where Assessed - Grooming: Supported standing Upper Body Bathing: Simulated;Set up Where Assessed - Upper Body Bathing: Supported sitting Lower Body Bathing: Simulated;Minimal assistance Where Assessed - Lower Body Bathing: Supported sit to stand Upper Body Dressing: Simulated;Set up (Pt reports donning lumbar corsett without assist) Where Assessed - Upper Body Dressing: Unsupported sitting Lower Body Dressing: Performed;Supervision/safety (Using sockaide and reacher) Where Assessed - Lower Body Dressing: Unsupported sit to stand Toilet Transfer: Simulated;Min guard (Pt used walker to push up on for sit to stand.) Toilet Transfer Equipment: Regular height toilet Toileting - Clothing  Manipulation and Hygiene: Simulated;Min guard Where Assessed - Toileting Clothing Manipulation and Hygiene: Sit to stand from 3-in-1 or toilet Tub/Shower Transfer Method: Not assessed Equipment Used: Back brace;Rolling walker Transfers/Ambulation Related to ADLs: Pt min guard for mobility.  Demonstrates slight increased trunk/lumbar flexion in standing.  Needs walker to help with back support and to stand more upright. ADL Comments: Pt and husband educated on AE for LB selfcare as well as back precautions.  Pt very sleepy this am and at times would close eyes.  Very apologetic for this but feel she will benefit from 1 more visit prior to discharge.  Will need 3:1 for shower and toileting.    OT Diagnosis: Generalized weakness;Acute pain  OT Problem List: Decreased strength;Decreased activity tolerance;Pain;Decreased knowledge of precautions;Decreased knowledge of use of DME or AE OT Treatment Interventions: Self-care/ADL training;Therapeutic exercise;DME and/or AE instruction;Balance training;Patient/family education;Therapeutic activities   OT Goals Acute Rehab OT Goals OT Goal Formulation: With patient/family ADL Goals Pt Will Transfer to Toilet: with modified independence;with DME;3-in-1;Maintaining back safety precautions ADL Goal: Toilet Transfer - Progress: Goal set today Pt Will Perform Tub/Shower Transfer: Shower transfer;with supervision;with DME;Maintaining back safety precautions ADL Goal: Tub/Shower Transfer - Progress: Goal set today Additional ADL Goal #1: Pt will verbalize 3/3 back precautions independently. ADL Goal: Additional Goal #1 - Progress: Goal set today  Visit Information  Last OT Received On: 01/05/12    Subjective Data  Subjective: I'm so sleepy this morning Patient Stated Goal: To be pain free.   Prior Functioning  Home Living Lives With: Spouse Available Help at Discharge: Available 24 hours/day (Husband initally until Sunday) Type of Home: House Home  Access: Stairs to enter [employer] of Steps: 4 steps,     However there  is a level entrance they might use as well if needed. Entrance Stairs-Rails: None Home Layout: Two level;Full bath on main level;Able to live on main level with bedroom/bathroom Bathroom Shower/Tub: Health visitor: Standard Bathroom Accessibility: Yes Home Adaptive Equipment: Environmental consultant - standard Prior Function Level of Independence: Independent Able to Take Stairs?: Yes Driving: Yes Communication Communication: No difficulties Dominant Hand: Right    Cognition  Overall Cognitive Status: Appears within functional limits for tasks assessed/performed Arousal/Alertness: Other (comment) (Pt very sleepy) Orientation Level: Appears intact for tasks assessed Behavior During Session: [facility] for tasks performed    Extremity/Trunk Assessment Right Upper Extremity Assessment RUE ROM/Strength/Tone: Unable to fully assess;Within functional levels;Due to precautions [facility] for AROM, strength not tested secondary to precautions.) RUE Sensation: WFL - Light Touch RUE Coordination: WFL - gross/fine motor Left Upper Extremity Assessment LUE ROM/Strength/Tone: Within functional levels;Unable to fully assess;Due to precautions LUE Sensation: WFL - Light Touch LUE Coordination: WFL - gross/fine motor   Mobility Transfers Transfers: Sit to Stand Sit to Stand: 4: Min guard;With upper extremity assist;From bed;From chair/3-in-1      Balance Balance Balance Assessed: Yes Static Standing Balance Static Standing - Balance Support: Right upper extremity supported;Left upper extremity supported Static Standing - Level of Assistance: 5: Stand by assistance Dynamic Standing Balance Dynamic Standing - Balance Support: Right upper extremity supported;Left upper extremity supported Dynamic Standing - Level of Assistance: 5: Stand by assistance (Min guard with use of RW)  End of Session OT - End of Session Equipment  Utilized During Treatment: Back brace Activity Tolerance: Patient limited by pain Patient left: in chair;with call bell/phone within reach;with family/visitor present   Sharon Hughes 01/05/2012, 9:12 AM

## 2012-01-05 NOTE — Discharge Summary (Signed)
Physician Discharge Summary  Patient ID: Sharon Hughes MRN: [id number] DOB/AGE: [birth year] 46 y.o.  Admit date: 01/04/2012 Discharge date: 01/05/2012  Admission Diagnoses:  Discharge Diagnoses:  Principal Problem:  *Lumbar stenosis with neurogenic claudication   Discharged Condition: good  Hospital Course: Patient in the hospital where she underwent an uncomplicated L3-4 anterolateral decompression and fusion. Postoperative she is done well. Preoperative back and right lower chamois pain improved. Strength cessation intact. Wound is healing well. Plan for discharge home.  Consults:   Significant Diagnostic Studies:   Treatments:   Discharge Exam: Blood pressure 127/78, pulse 81, temperature 97.8 F (36.6 C), temperature source Oral, resp. rate 16, last menstrual period 10/02/2011, SpO2 97.00%. Patient awake alert oriented and appropriate. Cranial nerve function is intact. Motor sensory function intact. Wound healing well. Chest and abdomen benign.  Disposition: 01-Home or Self Care   Medication List  As of 01/05/2012  9:39 AM   TAKE these medications         albuterol 108 (90 BASE) MCG/ACT inhaler   Commonly known as: PROVENTIL HFA;VENTOLIN HFA   Inhale 2 puffs into the lungs every 4 (four) hours as needed. For shortness of breath      clonazePAM 1 MG tablet   Commonly known as: KLONOPIN   Take 1 mg by mouth 2 (two) times daily as needed.      diazepam 5 MG tablet   Commonly known as: VALIUM   Take 1-2 tablets (5-10 mg total) by mouth every 6 (six) hours as needed.      HYDROmorphone 2 MG tablet   Commonly known as: DILAUDID   Take 1-2 tablets (2-4 mg total) by mouth every 4 (four) hours as needed for pain.      levothyroxine 150 MCG tablet   Commonly known as: SYNTHROID, LEVOTHROID   Take 150 mcg by mouth daily.      lisinopril-hydrochlorothiazide 20-12.5 MG per tablet   Commonly known as: PRINZIDE,ZESTORETIC   Take 1 tablet by mouth daily.      methadone  10 MG/ML solution   Commonly known as: DOLOPHINE   Take 220 mg by mouth daily.      polyethylene glycol packet   Commonly known as: MIRALAX / GLYCOLAX   Take 17 g by mouth daily.      TAGAMET PO   Take 1 tablet by mouth daily as needed. For stomach acid           Follow-up Information    Follow up with Jackston Oaxaca A, MD. Call in 1 week. (ask for crystal)    Contact information:   [address] [place] [address] [phone]          Signed: [name] 01/05/2012, 9:39 AM

## 2012-08-24 ENCOUNTER — Other Ambulatory Visit: Payer: Self-pay | Admitting: Neurosurgery

## 2012-08-24 DIAGNOSIS — M48061 Spinal stenosis, lumbar region without neurogenic claudication: Secondary | ICD-10-CM

## 2012-08-29 ENCOUNTER — Other Ambulatory Visit: Payer: 59

## 2012-08-29 ENCOUNTER — Ambulatory Visit
Admission: RE | Admit: 2012-08-29 | Discharge: 2012-08-29 | Disposition: A | Discharge status: Home / Self Care | Payer: 59 | Source: Ambulatory Visit | Attending: Neurosurgery | Admitting: Neurosurgery

## 2012-08-29 DIAGNOSIS — M48061 Spinal stenosis, lumbar region without neurogenic claudication: Secondary | ICD-10-CM

## 2013-05-18 ENCOUNTER — Emergency Department (HOSPITAL_COMMUNITY)
Admission: EM | Admit: 2013-05-18 | Discharge: 2013-05-18 | Disposition: A | Discharge status: Home / Self Care | Payer: 59 | Attending: Emergency Medicine | Admitting: Emergency Medicine

## 2013-05-18 ENCOUNTER — Emergency Department (HOSPITAL_COMMUNITY): Payer: 59

## 2013-05-18 ENCOUNTER — Encounter (HOSPITAL_COMMUNITY): Payer: Self-pay | Admitting: *Deleted

## 2013-05-18 ENCOUNTER — Ambulatory Visit (INDEPENDENT_AMBULATORY_CARE_PROVIDER_SITE_OTHER): Payer: Self-pay | Admitting: General Surgery

## 2013-05-18 DIAGNOSIS — Z8739 Personal history of other diseases of the musculoskeletal system and connective tissue: Secondary | ICD-10-CM | POA: Insufficient documentation

## 2013-05-18 DIAGNOSIS — Z872 Personal history of diseases of the skin and subcutaneous tissue: Secondary | ICD-10-CM | POA: Insufficient documentation

## 2013-05-18 DIAGNOSIS — Z8701 Personal history of pneumonia (recurrent): Secondary | ICD-10-CM | POA: Insufficient documentation

## 2013-05-18 DIAGNOSIS — Z9889 Other specified postprocedural states: Secondary | ICD-10-CM | POA: Insufficient documentation

## 2013-05-18 DIAGNOSIS — Z87891 Personal history of nicotine dependence: Secondary | ICD-10-CM | POA: Insufficient documentation

## 2013-05-18 DIAGNOSIS — I10 Essential (primary) hypertension: Secondary | ICD-10-CM | POA: Insufficient documentation

## 2013-05-18 DIAGNOSIS — Z8711 Personal history of peptic ulcer disease: Secondary | ICD-10-CM | POA: Insufficient documentation

## 2013-05-18 DIAGNOSIS — Z3202 Encounter for pregnancy test, result negative: Secondary | ICD-10-CM | POA: Insufficient documentation

## 2013-05-18 DIAGNOSIS — G8929 Other chronic pain: Secondary | ICD-10-CM | POA: Insufficient documentation

## 2013-05-18 DIAGNOSIS — R110 Nausea: Secondary | ICD-10-CM | POA: Insufficient documentation

## 2013-05-18 DIAGNOSIS — Z79899 Other long term (current) drug therapy: Secondary | ICD-10-CM | POA: Insufficient documentation

## 2013-05-18 DIAGNOSIS — Z9089 Acquired absence of other organs: Secondary | ICD-10-CM | POA: Insufficient documentation

## 2013-05-18 DIAGNOSIS — Z8719 Personal history of other diseases of the digestive system: Secondary | ICD-10-CM | POA: Insufficient documentation

## 2013-05-18 DIAGNOSIS — Z9851 Tubal ligation status: Secondary | ICD-10-CM | POA: Insufficient documentation

## 2013-05-18 DIAGNOSIS — J45909 Unspecified asthma, uncomplicated: Secondary | ICD-10-CM | POA: Insufficient documentation

## 2013-05-18 DIAGNOSIS — E039 Hypothyroidism, unspecified: Secondary | ICD-10-CM | POA: Insufficient documentation

## 2013-05-18 DIAGNOSIS — M549 Dorsalgia, unspecified: Secondary | ICD-10-CM | POA: Insufficient documentation

## 2013-05-18 DIAGNOSIS — Z87440 Personal history of urinary (tract) infections: Secondary | ICD-10-CM | POA: Insufficient documentation

## 2013-05-18 DIAGNOSIS — R109 Unspecified abdominal pain: Secondary | ICD-10-CM | POA: Insufficient documentation

## 2013-05-18 HISTORY — DX: Gastric ulcer, unspecified as acute or chronic, without hemorrhage or perforation: K25.9

## 2013-05-18 LAB — URINALYSIS, ROUTINE W REFLEX MICROSCOPIC
Glucose, UA: NEGATIVE mg/dL
Ketones, ur: NEGATIVE mg/dL
Leukocytes, UA: NEGATIVE
Nitrite: NEGATIVE
Protein, ur: NEGATIVE mg/dL
Urobilinogen, UA: 0.2 mg/dL (ref 0.0–1.0)

## 2013-05-18 LAB — COMPREHENSIVE METABOLIC PANEL
AST: 19 U/L (ref 0–37)
BUN: 13 mg/dL (ref 6–23)
CO2: 27 mEq/L (ref 19–32)
Calcium: 9.1 mg/dL (ref 8.4–10.5)
Creatinine, Ser: 1.09 mg/dL (ref 0.50–1.10)
GFR calc non Af Amer: 60 mL/min — ABNORMAL LOW (ref >90)

## 2013-05-18 LAB — LIPASE, BLOOD: Lipase: 21 U/L (ref 11–59)

## 2013-05-18 LAB — CBC WITH DIFFERENTIAL/PLATELET
Basophils Absolute: 0 10*3/uL (ref 0.0–0.1)
Basophils Relative: 0 % (ref 0–1)
Eosinophils Relative: 2 % (ref 0–5)
HCT: 38.3 % (ref 36.0–46.0)
Lymphocytes Relative: 38 % (ref 12–46)
MCHC: 33.4 g/dL (ref 30.0–36.0)
MCV: 95.5 fL (ref 78.0–100.0)
Monocytes Absolute: 0.5 10*3/uL (ref 0.1–1.0)
Monocytes Relative: 7 % (ref 3–12)
RDW: 13.8 % (ref 11.5–15.5)

## 2013-05-18 LAB — POCT PREGNANCY, URINE: Preg Test, Ur: NEGATIVE

## 2013-05-18 MED ORDER — HYDROMORPHONE HCL PF 1 MG/ML IJ SOLN
0.5000 mg | Freq: Once | INTRAMUSCULAR | Status: AC
  Administered 2013-05-18: 0.5 mg via INTRAVENOUS
  Filled 2013-05-18: qty 1

## 2013-05-18 MED ORDER — OXYCODONE-ACETAMINOPHEN 5-325 MG PO TABS
1.0000 | ORAL_TABLET | Freq: Once | ORAL | Status: AC
  Administered 2013-05-18: 1 via ORAL
  Filled 2013-05-18: qty 1

## 2013-05-18 MED ORDER — ONDANSETRON HCL 4 MG/2ML IJ SOLN
4.0000 mg | Freq: Once | INTRAMUSCULAR | Status: AC
  Administered 2013-05-18: 4 mg via INTRAVENOUS
  Filled 2013-05-18: qty 2

## 2013-05-18 NOTE — ED Notes (Signed)
Pt. C/o right sided flank pain radiating to right abdomen. Denies urinary difficulty or N/V.

## 2013-05-18 NOTE — ED Notes (Signed)
PT with R flank pain Wed evening.  Pain radiates to R lower abdomen.  Denies hematuria, but c/o decreased urine output.  Denies vaginal discharge or change in bowel habits. C/o nausea when the pain is intense.

## 2013-05-19 NOTE — ED Provider Notes (Signed)
CSN: [id number]     Arrival date & time 05/18/13  1753 History   First MD Initiated Contact with Patient 05/18/13 1952     Chief Complaint  Patient presents with  . Flank Pain   (Consider location/radiation/quality/duration/timing/severity/associated sxs/prior Treatment) HPI This a 47 rolled female with history of hypertension, chronic back pain, and urinary tract infections who presents with right flank pain. Patient reports right-sided flank pain that started last night. She states that it is sharp and it is nonradiating. She denies any dysuria or frequency. She denies any hematuria. She rates her pain a 9/10. Patient reports last bowel movement was today. She denies any vomiting or diarrhea. Pain is not worsened with movement or breathing. Patient denies any fevers, chest pain, shortness of breath, focal weakness or numbness.   Past Medical History  Diagnosis Date  . Hypertension     takes Lisinopril prn  . Asthma     Advair daily and Albuterol prn  . Pneumonia     hx of--2009  . Bronchitis 2011  . Seasonal allergies   . Headache(784.0)     occasionally  . Chronic back pain     degenerative disc and stenosis  . Arthritis   . Eczema     hx of--no problems in 27yrs   . Barrett's esophagus     takes Tagamet daily  . Ulcer     unsure if gastric/peptic/duodenal but cleared up with carafate  . 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 Hemorrhoids     banded  . UTI (lower urinary tract infection)     finished up antibiotics last week   . Hypothyroidism     takes Levothyroxine daily  . Gastric ulcer    Past Surgical History  Procedure Laterality Date  . Lumbar laminectomy  06/14/2009  . Cervical fusion    . Cholecystectomy    . Cesarean section      x 3  . Tubal ligation      with one of the c/s  . Tubal reversed    . Esophagogastroduodenoscopy    . Colonoscopy    . Anterior lat lumbar fusion  01/04/2012    Procedure: ANTERIOR LATERAL  LUMBAR FUSION 1 LEVEL;  Surgeon: [name], MD;  Location: [facility];  Service: Neurosurgery;  Laterality: Left;  ANTERIOR LATERAL LUMBAR THREE-FOUR EXTREME LUMBAR INTERBODY FUSION/Lateral plating   Family History  Problem Relation Age of Onset  . Anesthesia problems Neg Hx   . Hypotension Neg Hx   . Malignant hyperthermia Neg Hx   . Pseudochol deficiency Neg Hx    History  Substance Use Topics  . Smoking status: Former Smoker -- 0.25 packs/day for 3 years    Types: Cigarettes    Quit date: 09/18/2012  . Smokeless tobacco: Not on file  . Alcohol Use: No   OB History   Grav Para Term Preterm Abortions TAB SAB Ect Mult Living                 Review of Systems  Constitutional: Negative for fever.  Respiratory: Negative for cough, chest tightness and shortness of breath.   Cardiovascular: Negative for chest pain.  Gastrointestinal: Positive for nausea. Negative for vomiting and abdominal pain.  Genitourinary: Positive for flank pain. Negative for dysuria.  Musculoskeletal: Positive for back pain.  Skin: Negative for wound.  Neurological: Negative for headaches.  Psychiatric/Behavioral: Negative for confusion.  All other systems reviewed and are  negative.    Allergies  Ativan; Other; Toradol; and Vistaril  Home Medications   Current Outpatient Rx  Name  Route  Sig  Dispense  Refill  . albuterol (PROVENTIL HFA;VENTOLIN HFA) 108 (90 BASE) MCG/ACT inhaler   Inhalation   Inhale 2 puffs into the lungs every 4 (four) hours as needed for wheezing or shortness of breath.          . Cimetidine (TAGAMET PO)   Oral   Take 1 tablet by mouth daily as needed (acid reflux).          . clonazePAM (KLONOPIN) 1 MG tablet   Oral   Take 1 mg by mouth 2 (two) times daily as needed for anxiety.          . gabapentin (NEURONTIN) 600 MG tablet   Oral   Take 600 mg by mouth 4 (four) times daily.         Marland Kitchen lactulose (CHRONULAC) 10 GM/15ML solution   Oral   Take 30 mLs by  mouth 2 (two) times daily as needed.         Marland Kitchen levothyroxine (SYNTHROID, LEVOTHROID) 150 MCG tablet   Oral   Take 150 mcg by mouth daily.         Marland Kitchen lisinopril-hydrochlorothiazide (PRINZIDE,ZESTORETIC) 20-12.5 MG per tablet   Oral   Take 1 tablet by mouth daily.         . methadone (DOLOPHINE) 10 MG/ML solution   Oral   Take 220 mg by mouth daily.         . metroNIDAZOLE (FLAGYL) 500 MG tablet   Oral   Take 500 mg by mouth 2 (two) times daily. For 7 days; Start date 05/15/13         . polyethylene glycol (MIRALAX / GLYCOLAX) packet   Oral   Take 17 g by mouth daily.         . sucralfate (CARAFATE) 1 G tablet   Oral   Take 1 g by mouth 2 (two) times daily.          BP 111/73  Pulse 84  Temp(Src) 98.5 F (36.9 C) (Oral)  Resp 18  Ht 5\' 4"  (1.626 m)  Wt 233 lb (105.688 kg)  BMI 39.97 kg/m2  SpO2 98% Physical Exam  Nursing note and vitals reviewed. Constitutional: She is oriented to person, place, and time. She appears well-developed and well-nourished.  HENT:  Head: Normocephalic and atraumatic.  Eyes: Pupils are equal, round, and reactive to light.  Neck: Neck supple.  Cardiovascular: Normal rate, regular rhythm and normal heart sounds.   Pulmonary/Chest: Effort normal. No respiratory distress. She has no wheezes.  Abdominal: Soft. Bowel sounds are normal. There is no tenderness.  Genitourinary:  No tenderness to palpation over the right flank.  No CVA tenderness.  Neurological: She is alert and oriented to person, place, and time.  Skin: Skin is warm and dry.  Psychiatric: She has a normal mood and affect.    ED Course  Procedures (including critical care time) Labs Review Labs Reviewed  COMPREHENSIVE METABOLIC PANEL - Abnormal; Notable for the following:    Total Bilirubin 0.1 (*)    GFR calc non Af Amer 60 (*)    GFR calc Af Amer 69 (*)    All other components within normal limits  URINALYSIS, ROUTINE W REFLEX MICROSCOPIC - Abnormal; Notable  for the following:    Specific Gravity, Urine 1.034 (*)    All other components within normal  limits  CBC WITH DIFFERENTIAL  LIPASE, BLOOD  POCT PREGNANCY, URINE   Imaging Review Ct Abdomen Pelvis Wo Contrast  05/18/2013   CLINICAL DATA:  Right flank pain.  EXAM: CT ABDOMEN AND PELVIS WITHOUT CONTRAST  TECHNIQUE: Multidetector CT imaging of the abdomen and pelvis was performed following the standard protocol without intravenous contrast.  COMPARISON:  12/02/2011.  FINDINGS: The lung bases are clear.  The unenhanced appearance of the liver is unremarkable. Pneumobilia is noted likely due to previous cholecystectomy and sphincterotomy. No worrisome hepatic lesions or biliary dilatation. No common bowel duct dilatation. The pancreas is normal. The spleen is normal. The adrenal glands and kidneys are normal. No renal or obstructing ureteral calculi. No bladder calculi.  The stomach, duodenum, small bowel and colon are grossly normal without oral contrast. No inflammatory changes or mass lesions. Moderate stool throughout the colon may suggest constipation. The appendix is normal.  No mesenteric or retroperitoneal mass or adenopathy. The aorta is normal in caliber.  The uterus and ovaries are unremarkable. There is a simple appearing cyst associated with the right ovary.  The bony structures are unremarkable. Lumbar fusion hardware noted at L3-4 and L4-5. No complicating features are identified. The bony pelvis is intact.  IMPRESSION: No acute abdominal/ pelvic findings, mass lesions or adenopathy.  No renal or obstructing ureteral calculi.   Electronically Signed   By: Loralie Champagne M.D.   On: 05/18/2013 23:10    MDM   1. Flank pain     This 47 yo female who presents with right flank pain onset last night. It is nonradiating. Patient is nontoxic-appearing on exam and vital signs are within normal limits. Patient does have chronic back pain for which she takes methadone. She states that this is  different. She has no tenderness on exam and her abdominal exam is benign. Basic labwork is unremarkable. Patient was sent her for CT scan to rule out kidney stones. CT scan is negative. At this time I'm unsure the etiology for her flank pain. It does not appear to be musculoskeletal as there is no tenderness to palpation. She's not hypoxic and have low suspicion for pulmonary pathology. She's also PERC negative.  Patient will be discharged home. She is encouraged to add ibuprofen to her pain regimen. She is followed at a pain clinic and is on chronic methadone. I will not add further narcotics her pain regimen. Patient was given strict return precautions.  After history, exam, and medical workup I feel the patient has been appropriately medically screened and is safe for discharge home. Pertinent diagnoses were discussed with the patient. Patient was given return precautions.      Shon Baton, MD 05/19/13 [phone]

## 2013-05-23 ENCOUNTER — Other Ambulatory Visit: Payer: Self-pay | Admitting: Neurosurgery

## 2013-05-23 DIAGNOSIS — M961 Postlaminectomy syndrome, not elsewhere classified: Secondary | ICD-10-CM

## 2013-05-25 ENCOUNTER — Ambulatory Visit (INDEPENDENT_AMBULATORY_CARE_PROVIDER_SITE_OTHER): Payer: 59 | Admitting: General Surgery

## 2013-05-25 ENCOUNTER — Encounter (INDEPENDENT_AMBULATORY_CARE_PROVIDER_SITE_OTHER): Payer: Self-pay | Admitting: General Surgery

## 2013-05-25 VITALS — BP 136/74 | HR 104 | Temp 98.4°F | Resp 20 | Ht 64.0 in | Wt 231.6 lb

## 2013-05-25 DIAGNOSIS — E669 Obesity, unspecified: Secondary | ICD-10-CM | POA: Insufficient documentation

## 2013-05-25 NOTE — Patient Instructions (Signed)
We will get copies of your EGD and review them and I will call you Please call with any questions.

## 2013-05-26 NOTE — Progress Notes (Signed)
Patient ID: Sharon Hughes, female   DOB: 11/03/1965, 47 y.o.   MRN: [id number]  Chief Complaint  Patient presents with  . Other    new bari- sleeve    HPI Sharon Hughes is a 47 y.o. female.   HPI 47 yo obese WF referred by Dr Wynelle Link at [facility] for evaluation of weight loss surgery. The patient states that she has struggled with her weight pretty much her entire adult life. She has tried numerous attempts for sustained weight loss. She has tried Weight Watchers, Atkins diet, Slim fast, Fen/Phen, ephedrine, and phentermine. All of which without any long-term success. Her comorbidities include hypertension, gastroesophageal reflux disease, possible Barrett's esophagus, cervical and lumbar degenerative disease, hypothyroidism, and probable obstructive sleep apnea. She states that she has an upcoming sleep study next week.  She states that she is interested in a sleeve gastrectomy because she is on a lot of research on it and feels that would be the best procedure for her. She is concerned about having a laparoscopic adjustable gastric band procedure because the people she knows that have had that procedure have experienced weight regain. Past Medical History  Diagnosis Date  . Hypertension     takes Lisinopril prn  . Asthma     Advair daily and Albuterol prn  . Pneumonia     hx of--2009  . Bronchitis 2011  . Seasonal allergies   . Headache(784.0)     occasionally  . Chronic back pain     degenerative disc and stenosis  . Arthritis   . Eczema     hx of--no problems in 58yrs   . Barrett's esophagus     takes Tagamet daily  . Ulcer     unsure if gastric/peptic/duodenal but cleared up with carafate  . 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 Hemorrhoids     banded  . UTI (lower urinary tract infection)     finished up antibiotics last week   . Hypothyroidism     takes Levothyroxine daily  . Gastric ulcer   . Degenerative disc disease      Past Surgical History  Procedure Laterality Date  . Lumbar laminectomy  06/14/2009  . Cervical fusion    . Cholecystectomy    . Cesarean section      x 3  . Tubal ligation      with one of the c/s  . Tubal reversed    . Esophagogastroduodenoscopy    . Colonoscopy    . Anterior lat lumbar fusion  01/04/2012    Procedure: ANTERIOR LATERAL LUMBAR FUSION 1 LEVEL;  Surgeon: [name], MD;  Location: [facility];  Service: Neurosurgery;  Laterality: Left;  ANTERIOR LATERAL LUMBAR THREE-FOUR EXTREME LUMBAR INTERBODY FUSION/Lateral plating    Family History  Problem Relation Age of Onset  . Anesthesia problems Neg Hx   . Hypotension Neg Hx   . Malignant hyperthermia Neg Hx   . Pseudochol deficiency Neg Hx   . Stroke Maternal Grandmother     Social History History  Substance Use Topics  . Smoking status: Former Smoker -- 0.25 packs/day for 3 years    Types: Cigarettes    Quit date: 09/18/2012  . Smokeless tobacco: Not on file  . Alcohol Use: No    Allergies  Allergen Reactions  . Ativan [Lorazepam] Other (See Comments)    Aggravates restless leg syndrome  . Other Other (See Comments)  Melons cause bumps and swelling of the throat  . Toradol [Ketorolac Tromethamine] Other (See Comments)    Aggravates restless leg syndrome  . Vistaril [Hydroxyzine Hcl] Other (See Comments)    Aggravates restless leg syndrome    Current Outpatient Prescriptions  Medication Sig Dispense Refill  . albuterol (PROVENTIL HFA;VENTOLIN HFA) 108 (90 BASE) MCG/ACT inhaler Inhale 2 puffs into the lungs every 4 (four) hours as needed for wheezing or shortness of breath.       . Cimetidine (TAGAMET PO) Take 1 tablet by mouth daily as needed (acid reflux).       . gabapentin (NEURONTIN) 600 MG tablet Take 600 mg by mouth 4 (four) times daily.      Marland Kitchen lactulose (CHRONULAC) 10 GM/15ML solution Take 30 mLs by mouth 2 (two) times daily as needed.      Marland Kitchen levothyroxine (SYNTHROID, LEVOTHROID) 150 MCG  tablet Take 150 mcg by mouth daily.      Marland Kitchen lisinopril-hydrochlorothiazide (PRINZIDE,ZESTORETIC) 20-12.5 MG per tablet Take 1 tablet by mouth daily.      . methadone (DOLOPHINE) 10 MG/ML solution Take 220 mg by mouth daily.      . sucralfate (CARAFATE) 1 G tablet Take 1 g by mouth 2 (two) times daily.       No current facility-administered medications for this visit.    Review of Systems Review of Systems  Constitutional: Negative for fever, chills and unexpected weight change.       Takes methadone for chronic back pain - has pain contract  HENT: Negative for congestion, hearing loss, sore throat, trouble swallowing and voice change.   Eyes: Negative for visual disturbance.  Respiratory: Negative for cough, chest tightness and wheezing.   Cardiovascular: Negative for chest pain, palpitations and leg swelling.       +DOE; denies CP, SOB; sleeps on 3 pillows - hard to breathe if lay flat; has upcoming sleep study. Uses inhaler as needed; intermittent LE swelling - uses Lasix once a month.   Gastrointestinal: Negative for nausea, vomiting, abdominal pain, diarrhea, constipation, blood in stool and abdominal distention.       S/p lap chole. Has reflux. Has had several EGDs. Dr Noe Gens at [facility] told her she had Barretts. Saw a Dr Sallye Lat (sp?), GI doctor at [facility], who did EGD and was told no reflux. Has to take lactulose secondary to constipation. Has regular BMs now. Reports h/o gastric ulcers  Endocrine: Negative for cold intolerance, heat intolerance and polyphagia.  Genitourinary: Negative for dysuria, hematuria, vaginal bleeding, vaginal discharge, difficulty urinating and pelvic pain.       G3P3; irreg menses.   Musculoskeletal: Positive for neck pain. Negative for arthralgias.       Has had neck fusion and lower lumbar fusion. Has rt lateral thing numbness if sits too long - has CT myelogram scheduled. Lower back pain  Skin: Negative for rash and wound.  Neurological:  Positive for headaches (occassional migraines). Negative for dizziness, seizures and syncope.       Denies TIA/amaruosis fugax  Hematological: Negative for adenopathy. Does not bruise/bleed easily.  Psychiatric/Behavioral: Negative for confusion.    Blood pressure 136/74, pulse 104, temperature 98.4 F (36.9 C), temperature source Temporal, resp. rate 20, height 5\' 4"  (1.626 m), weight 231 lb 9.6 oz (105.053 kg).  Physical Exam Physical Exam  Vitals reviewed. Constitutional: She is oriented to person, place, and time. She appears well-developed and well-nourished. No distress.  obese  HENT:  Head: Normocephalic and atraumatic.  Right Ear: External ear normal.  Left Ear: External ear normal.  Eyes: Conjunctivae are normal. No scleral icterus.  Neck: Normal range of motion. Neck supple. No tracheal deviation present. No thyromegaly present.  Cardiovascular: Normal rate and normal heart sounds.   Pulmonary/Chest: Effort normal and breath sounds normal. No stridor. No respiratory distress. She has no wheezes.  Abdominal: Soft. She exhibits no distension. There is no tenderness. There is no rebound.  Musculoskeletal: She exhibits no edema and no tenderness.  Lymphadenopathy:    She has no cervical adenopathy.  Neurological: She is alert and oriented to person, place, and time. She exhibits normal muscle tone.  Skin: Skin is warm and dry. No rash noted. She is not diaphoretic. No erythema.  Psychiatric: She has a normal mood and affect. Her behavior is normal. Judgment and thought content normal.    Data Reviewed ED note 05/18/13 CT abd pelvis 05/18/13 Epworth sleepiness scale score 18  Assessment    Obesity BMI 39.75 Gastroesophageal reflux disease Possible Barrett's esophagus Hypertension Lumbar degenerative disc disease Hypothyroidism  Asthma Snoring/Fatigue     Plan    The patient meets weight loss surgery criteria. I think the patient would be an acceptable candidate  for weight loss surgery. However I explained to the patient that I am not sure she is a candidate for a laparoscopic sleeve gastrectomy due to the possibility of her having Barrett's esophagus. I explained to the patient that I would need to get her upper endoscopy records including her pathology reports to review them. I explained that she does have documented evidence of Barrett's esophagus we really need to do think twice before doing a laparoscopic vertical sleeve gastrectomy because of the possibility of worsening reflux which would increase her risk for worsening atypia. I explained that if she does have Barrett's esophagus may be better off with a laparoscopic Roux-en-Y gastric bypass procedure. We briefly discussed gastric bypass.  We discussed the surgical pathway which is the same for all procedures. She had recent lab work at her physician's office and we will obtain that labwork in order any missing lab work. I believe she needs a sleep study. She scored 18 on her Epworth Sleepiness Scale score.   We discussed that before and after surgery that there would be an alteration in their diet. I explained that we have put them on a diet 2 weeks before surgery. I also explained that they would be on a liquid diet for 2 weeks after surgery. We discussed that they would have to avoid certain foods such as sugar after surgery. We discussed the importance of physical activity as well as compliance with our dietary and supplement recommendations and routine follow-up.  I explained to the patient that we will start our evaluation process which includes labs, Upper GI to evaluate stomach and swallowing anatomy, nutritionist consultation, psychiatrist consultation, EKG, CXR, Cardiac echocardiogram due to her history of Phen-Fen use. We will also need to get her outside records with respect to her upper endoscopy and path reports. We will also need to get the results of her sleep study. I explained that once I  review her outside endoscopy reports and path reports I would contact her to discuss My recommendations.  Mary Sella. Andrey Campanile, MD, FACS General, Bariatric, & Minimally Invasive Surgery [facility], [place]         [facility] M 05/26/2013, 2:46 PM

## 2013-05-29 ENCOUNTER — Ambulatory Visit
Admission: RE | Admit: 2013-05-29 | Discharge: 2013-05-29 | Disposition: A | Discharge status: Home / Self Care | Payer: 59 | Source: Ambulatory Visit | Attending: Neurosurgery | Admitting: Neurosurgery

## 2013-05-29 ENCOUNTER — Other Ambulatory Visit (INDEPENDENT_AMBULATORY_CARE_PROVIDER_SITE_OTHER): Payer: Self-pay

## 2013-05-29 ENCOUNTER — Other Ambulatory Visit (INDEPENDENT_AMBULATORY_CARE_PROVIDER_SITE_OTHER): Payer: Self-pay | Admitting: General Surgery

## 2013-05-29 VITALS — BP 111/63 | HR 80

## 2013-05-29 DIAGNOSIS — M48062 Spinal stenosis, lumbar region with neurogenic claudication: Secondary | ICD-10-CM

## 2013-05-29 DIAGNOSIS — M961 Postlaminectomy syndrome, not elsewhere classified: Secondary | ICD-10-CM

## 2013-05-29 DIAGNOSIS — Z01810 Encounter for preprocedural cardiovascular examination: Secondary | ICD-10-CM

## 2013-05-29 MED ORDER — ONDANSETRON HCL 4 MG/2ML IJ SOLN
4.0000 mg | Freq: Once | INTRAMUSCULAR | Status: AC
  Administered 2013-05-29: 4 mg via INTRAMUSCULAR

## 2013-05-29 MED ORDER — MEPERIDINE HCL 100 MG/ML IJ SOLN
75.0000 mg | Freq: Once | INTRAMUSCULAR | Status: AC
  Administered 2013-05-29: 75 mg via INTRAMUSCULAR

## 2013-05-29 MED ORDER — IOHEXOL 180 MG/ML  SOLN
15.0000 mL | Freq: Once | INTRAMUSCULAR | Status: AC | PRN
  Administered 2013-05-29: 15 mL via INTRATHECAL

## 2013-05-29 MED ORDER — DIAZEPAM 5 MG PO TABS
10.0000 mg | ORAL_TABLET | Freq: Once | ORAL | Status: AC
  Administered 2013-05-29: 10 mg via ORAL

## 2013-05-30 ENCOUNTER — Other Ambulatory Visit (INDEPENDENT_AMBULATORY_CARE_PROVIDER_SITE_OTHER): Payer: Self-pay | Admitting: General Surgery

## 2013-05-31 ENCOUNTER — Encounter (INDEPENDENT_AMBULATORY_CARE_PROVIDER_SITE_OTHER): Payer: Self-pay | Admitting: General Surgery

## 2013-06-01 ENCOUNTER — Encounter (INDEPENDENT_AMBULATORY_CARE_PROVIDER_SITE_OTHER): Payer: Self-pay | Admitting: General Surgery

## 2013-06-01 ENCOUNTER — Encounter: Payer: Self-pay | Admitting: *Deleted

## 2013-06-02 ENCOUNTER — Other Ambulatory Visit: Payer: 59

## 2013-06-16 ENCOUNTER — Ambulatory Visit (HOSPITAL_COMMUNITY)
Admission: RE | Admit: 2013-06-16 | Discharge: 2013-06-16 | Disposition: A | Discharge status: Home / Self Care | Payer: 59 | Source: Ambulatory Visit | Attending: General Surgery | Admitting: General Surgery

## 2013-06-16 ENCOUNTER — Other Ambulatory Visit: Payer: Self-pay

## 2013-06-16 ENCOUNTER — Other Ambulatory Visit (HOSPITAL_COMMUNITY): Payer: Self-pay | Admitting: General Surgery

## 2013-06-16 DIAGNOSIS — Z6839 Body mass index (BMI) 39.0-39.9, adult: Secondary | ICD-10-CM | POA: Insufficient documentation

## 2013-06-16 DIAGNOSIS — M51379 Other intervertebral disc degeneration, lumbosacral region without mention of lumbar back pain or lower extremity pain: Secondary | ICD-10-CM | POA: Insufficient documentation

## 2013-06-16 DIAGNOSIS — Z01810 Encounter for preprocedural cardiovascular examination: Secondary | ICD-10-CM

## 2013-06-16 DIAGNOSIS — R0989 Other specified symptoms and signs involving the circulatory and respiratory systems: Secondary | ICD-10-CM | POA: Insufficient documentation

## 2013-06-16 DIAGNOSIS — Z713 Dietary counseling and surveillance: Secondary | ICD-10-CM

## 2013-06-16 DIAGNOSIS — K2270 Barrett's esophagus without dysplasia: Secondary | ICD-10-CM | POA: Insufficient documentation

## 2013-06-16 DIAGNOSIS — Z9089 Acquired absence of other organs: Secondary | ICD-10-CM | POA: Insufficient documentation

## 2013-06-16 DIAGNOSIS — R0609 Other forms of dyspnea: Secondary | ICD-10-CM | POA: Insufficient documentation

## 2013-06-16 DIAGNOSIS — K219 Gastro-esophageal reflux disease without esophagitis: Secondary | ICD-10-CM | POA: Insufficient documentation

## 2013-06-16 DIAGNOSIS — Z01818 Encounter for other preprocedural examination: Secondary | ICD-10-CM | POA: Insufficient documentation

## 2013-06-16 DIAGNOSIS — I10 Essential (primary) hypertension: Secondary | ICD-10-CM | POA: Insufficient documentation

## 2013-06-16 DIAGNOSIS — E039 Hypothyroidism, unspecified: Secondary | ICD-10-CM | POA: Insufficient documentation

## 2013-06-16 DIAGNOSIS — M5137 Other intervertebral disc degeneration, lumbosacral region: Secondary | ICD-10-CM | POA: Insufficient documentation

## 2013-06-16 NOTE — Progress Notes (Signed)
  Echocardiogram 2D Echocardiogram has been performed.  Lonie Newsham FRANCES 06/16/2013, 12:10 PM

## 2013-06-17 ENCOUNTER — Encounter: Payer: Self-pay | Admitting: Dietician

## 2013-06-17 ENCOUNTER — Encounter: Payer: 59 | Attending: General Surgery | Admitting: Dietician

## 2013-06-17 VITALS — Ht 64.0 in | Wt 232.4 lb

## 2013-06-17 DIAGNOSIS — E669 Obesity, unspecified: Secondary | ICD-10-CM

## 2013-06-17 DIAGNOSIS — Z713 Dietary counseling and surveillance: Secondary | ICD-10-CM | POA: Insufficient documentation

## 2013-06-17 NOTE — Patient Instructions (Signed)
Patient to call the [facility] to enroll in Pre-Op and Post-Op Nutrition Education when surgery date is scheduled. 

## 2013-06-17 NOTE — Progress Notes (Signed)
  Pre-Op Assessment Visit:  Pre-Operative RYGB Surgery  Medical Nutrition Therapy:  Appt start time: 0915   End time:  1000.  Patient was seen on 06/17/13 for Pre-Operative RYGB Nutrition Assessment. Assessment and letter of approval faxed to [facility] Bariatric Surgery Program coordinator on 06/17/13.   Handouts given during visit include:  Pre-Op Goals Bariatric Surgery Protein Shakes  Patient to call the [facility] to enroll in Pre-Op and Post-Op Nutrition Education when surgery date is scheduled.

## 2013-06-19 ENCOUNTER — Telehealth (INDEPENDENT_AMBULATORY_CARE_PROVIDER_SITE_OTHER): Payer: Self-pay | Admitting: General Surgery

## 2013-06-19 NOTE — Telephone Encounter (Signed)
Message copied by Liliana Cline on Mon Jun 19, 2013 10:04 AM ------      Message from: Andrey Campanile, ERIC M      Created: Fri Jun 16, 2013  6:49 PM       i reviewed the pt's egd results from [facility] - while technically she doesn't have Barrett's esophagus, she has stomach lining cells in her esophagus which is still indicative of bad reflux and probably "early" signs of impending Barretts down the road            Therefore I do NOT recommend a sleeve. Would better served by a bypass. pls call her and let her know I reviewed her records but essentially have been out of office for 2weeks and didn't want to leave her hangning.             i'm more than happy to call her when i'm back in office week after next. pls remind me to do so            Thanks            wilson ------

## 2013-06-19 NOTE — Telephone Encounter (Signed)
Called and spoke with patient. She understands and is completely okay with proceeding with bypass surgery. She still has to see the psychologist and then her testing will be complete. She states she will contact Sher[facility] about any insurance questions and will call us as needed.

## 2013-06-21 ENCOUNTER — Encounter (INDEPENDENT_AMBULATORY_CARE_PROVIDER_SITE_OTHER): Payer: Self-pay

## 2013-06-22 ENCOUNTER — Other Ambulatory Visit (INDEPENDENT_AMBULATORY_CARE_PROVIDER_SITE_OTHER): Payer: Self-pay | Admitting: General Surgery

## 2013-06-22 ENCOUNTER — Other Ambulatory Visit: Payer: Self-pay

## 2013-06-22 LAB — COMPREHENSIVE METABOLIC PANEL
Albumin: 4 g/dL (ref 3.5–5.2)
BUN: 14 mg/dL (ref 6–23)
CO2: 27 mEq/L (ref 19–32)
Calcium: 9.4 mg/dL (ref 8.4–10.5)
Chloride: 101 mEq/L (ref 96–112)
Glucose, Bld: 109 mg/dL — ABNORMAL HIGH (ref 70–99)
Potassium: 4.7 mEq/L (ref 3.5–5.3)
Total Bilirubin: 0.2 mg/dL — ABNORMAL LOW (ref 0.3–1.2)

## 2013-06-22 LAB — TSH: TSH: 3.849 u[IU]/mL (ref 0.350–4.500)

## 2013-07-12 ENCOUNTER — Encounter (INDEPENDENT_AMBULATORY_CARE_PROVIDER_SITE_OTHER): Payer: Self-pay | Admitting: General Surgery

## 2013-07-24 ENCOUNTER — Encounter (INDEPENDENT_AMBULATORY_CARE_PROVIDER_SITE_OTHER): Payer: Self-pay | Admitting: General Surgery

## 2013-08-08 NOTE — Progress Notes (Signed)
Dr. Andrey Campanile - Please enter preop orders in Epic for [facility].  Thanks.

## 2013-08-14 ENCOUNTER — Other Ambulatory Visit (INDEPENDENT_AMBULATORY_CARE_PROVIDER_SITE_OTHER): Payer: Self-pay | Admitting: General Surgery

## 2013-08-18 ENCOUNTER — Encounter (HOSPITAL_COMMUNITY): Payer: Self-pay | Admitting: Pharmacy Technician

## 2013-08-21 ENCOUNTER — Encounter (INDEPENDENT_AMBULATORY_CARE_PROVIDER_SITE_OTHER): Payer: Self-pay | Admitting: General Surgery

## 2013-08-24 ENCOUNTER — Encounter (HOSPITAL_COMMUNITY)
Admission: RE | Admit: 2013-08-24 | Discharge: 2013-08-24 | Disposition: A | Discharge status: Home / Self Care | Payer: 59 | Source: Ambulatory Visit | Attending: General Surgery | Admitting: General Surgery

## 2013-08-24 ENCOUNTER — Ambulatory Visit (INDEPENDENT_AMBULATORY_CARE_PROVIDER_SITE_OTHER): Payer: 59 | Admitting: General Surgery

## 2013-08-24 ENCOUNTER — Encounter: Payer: 59 | Attending: General Surgery

## 2013-08-24 ENCOUNTER — Encounter (HOSPITAL_COMMUNITY): Payer: Self-pay

## 2013-08-24 ENCOUNTER — Encounter (INDEPENDENT_AMBULATORY_CARE_PROVIDER_SITE_OTHER): Payer: Self-pay | Admitting: General Surgery

## 2013-08-24 VITALS — BP 118/64 | HR 78 | Temp 98.7°F | Resp 16 | Ht 64.0 in | Wt 227.4 lb

## 2013-08-24 DIAGNOSIS — E669 Obesity, unspecified: Secondary | ICD-10-CM | POA: Insufficient documentation

## 2013-08-24 DIAGNOSIS — Z01812 Encounter for preprocedural laboratory examination: Secondary | ICD-10-CM | POA: Insufficient documentation

## 2013-08-24 DIAGNOSIS — Z713 Dietary counseling and surveillance: Secondary | ICD-10-CM | POA: Insufficient documentation

## 2013-08-24 HISTORY — DX: Sleep apnea, unspecified: G47.30

## 2013-08-24 LAB — COMPREHENSIVE METABOLIC PANEL
ALT: 17 U/L (ref 0–35)
AST: 17 U/L (ref 0–37)
Albumin: 4.1 g/dL (ref 3.5–5.2)
Alkaline Phosphatase: 48 U/L (ref 39–117)
BUN: 18 mg/dL (ref 6–23)
CO2: 27 mEq/L (ref 19–32)
Calcium: 9.7 mg/dL (ref 8.4–10.5)
Chloride: 97 mEq/L (ref 96–112)
Creatinine, Ser: 0.9 mg/dL (ref 0.50–1.10)
GFR calc Af Amer: 88 mL/min — ABNORMAL LOW (ref >90)
GFR calc non Af Amer: 76 mL/min — ABNORMAL LOW (ref >90)
Glucose, Bld: 106 mg/dL — ABNORMAL HIGH (ref 70–99)
Potassium: 4.3 mEq/L (ref 3.7–5.3)
SODIUM: 138 meq/L (ref 137–147)
TOTAL PROTEIN: 7.7 g/dL (ref 6.0–8.3)
Total Bilirubin: 0.2 mg/dL — ABNORMAL LOW (ref 0.3–1.2)

## 2013-08-24 LAB — CBC WITH DIFFERENTIAL/PLATELET
BASOS ABS: 0 10*3/uL (ref 0.0–0.1)
Basophils Relative: 0 % (ref 0–1)
EOS ABS: 0.1 10*3/uL (ref 0.0–0.7)
Eosinophils Relative: 1 % (ref 0–5)
HCT: 37.3 % (ref 36.0–46.0)
Hemoglobin: 12.4 g/dL (ref 12.0–15.0)
LYMPHS PCT: 31 % (ref 12–46)
Lymphs Abs: 2.6 10*3/uL (ref 0.7–4.0)
MCH: 30.7 pg (ref 26.0–34.0)
MCHC: 33.2 g/dL (ref 30.0–36.0)
MCV: 92.3 fL (ref 78.0–100.0)
Monocytes Absolute: 0.6 10*3/uL (ref 0.1–1.0)
Monocytes Relative: 7 % (ref 3–12)
NEUTROS PCT: 60 % (ref 43–77)
Neutro Abs: 4.9 10*3/uL (ref 1.7–7.7)
PLATELETS: 360 10*3/uL (ref 150–400)
RBC: 4.04 MIL/uL (ref 3.87–5.11)
RDW: 12.6 % (ref 11.5–15.5)
WBC: 8.1 10*3/uL (ref 4.0–10.5)

## 2013-08-24 NOTE — Progress Notes (Signed)
Patient ID: Sharon Hughes, female   DOB: 04-01-1966, 48 y.o.   MRN: [id number]  Chief Complaint  Patient presents with  . Bariatric Pre-op    Pre op for RNY 08/29/13    HPI Sharon Hughes is a 48 y.o. female.   HPI 48 year old obese Caucasian female comes in today for her preoperative appointment. She is currently scheduled to undergo laparoscopic Roux-en-Y gastric bypass on January 13. I initially met the patient on October 10. At that time she was interested and sleeve gastrectomy but after reviewing her outside endoscopy reports and pathology reports I contacted the patient and recommended avoiding a sleeve gastrectomy because of her severe reflux and Barrett's esophagus history. She states that she has done additional reading and research on gastric bypass and believes this is the correct procedure for her. She did end up having the sleep study at [facility] and was found to have severe sleep apnea. She states she is using it however she is still not sleeping well. She is waking up every 2 hours. She states that her mouth is very dry. Otherwise she denies any changes to her medical history. She does have a pain contract with [facility]. She states that she was told by their office they would contact me on Monday to discuss her perioperative pain management control.   05/26/13: 48 yo obese WF referred by Dr Nancy Fetter at [facility] for evaluation of weight loss surgery. The patient states that she has struggled with her weight pretty much her entire adult life. She has tried numerous attempts for sustained weight loss. She has tried Weight Watchers, Atkins diet, Slim fast, Fen/Phen, ephedrine, and phentermine. All of which without any long-term success. Her comorbidities include hypertension, gastroesophageal reflux disease, possible Barrett's esophagus, cervical and lumbar degenerative disease, hypothyroidism, and probable obstructive sleep apnea. She states that she  has an upcoming sleep study next week.  She states that she is interested in a sleeve gastrectomy because she is on a lot of research on it and feels that would be the best procedure for her. She is concerned about having a laparoscopic adjustable gastric band procedure because the people she knows that have had that procedure have experienced weight regain. Past Medical History  Diagnosis Date  . Hypertension     takes Lisinopril prn  . Asthma     Advair daily and Albuterol prn  . Pneumonia     hx of--2009  . Bronchitis 2011  . Seasonal allergies   . Headache(784.0)     occasionally  . Chronic back pain     degenerative disc and stenosis  . Arthritis   . Eczema     hx of--no problems in 21yr   . Barrett's esophagus     takes Tagamet daily  . Ulcer     unsure if gastric/peptic/duodenal but cleared up with carafate  . 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 Hemorrhoids     banded  . UTI (lower urinary tract infection)     finished up antibiotics last week   . Hypothyroidism     takes Levothyroxine daily  . Gastric ulcer   . Degenerative disc disease     Past Surgical History  Procedure Laterality Date  . Lumbar laminectomy  06/14/2009  . Cervical fusion    . Cholecystectomy    . Cesarean section      x 3  . Tubal ligation  with one of the c/s  . Tubal reversed    . Esophagogastroduodenoscopy    . Colonoscopy    . Anterior lat lumbar fusion  01/04/2012    Procedure: ANTERIOR LATERAL LUMBAR FUSION 1 LEVEL;  Surgeon: Charlie Pitter, MD;  Location: [facility];  Service: Neurosurgery;  Laterality: Left;  ANTERIOR LATERAL LUMBAR THREE-FOUR EXTREME LUMBAR INTERBODY FUSION/Lateral plating    Family History  Problem Relation Age of Onset  . Anesthesia problems Neg Hx   . Hypotension Neg Hx   . Malignant hyperthermia Neg Hx   . Pseudochol deficiency Neg Hx   . Stroke Maternal Grandmother     Social History History  Substance Use  Topics  . Smoking status: Former Smoker -- 0.25 packs/day for 3 years    Types: Cigarettes    Quit date: 09/18/2012  . Smokeless tobacco: Not on file  . Alcohol Use: No    Allergies  Allergen Reactions  . Ativan [Lorazepam] Other (See Comments)    Aggravates restless leg syndrome  . Other Other (See Comments)    Melons cause bumps and swelling of the throat  . Toradol [Ketorolac Tromethamine] Other (See Comments)    Aggravates restless leg syndrome  . Vistaril [Hydroxyzine Hcl] Other (See Comments)    Aggravates restless leg syndrome    Current Outpatient Prescriptions  Medication Sig Dispense Refill  . albuterol (PROVENTIL HFA;VENTOLIN HFA) 108 (90 BASE) MCG/ACT inhaler Inhale 2 puffs into the lungs every 4 (four) hours as needed for wheezing or shortness of breath.       . gabapentin (NEURONTIN) 600 MG tablet Take 600 mg by mouth 4 (four) times daily as needed (pain).       Marland Kitchen lactulose (CHRONULAC) 10 GM/15ML solution Take 30 mLs by mouth 2 (two) times daily as needed for mild constipation or moderate constipation.       Marland Kitchen levothyroxine (SYNTHROID, LEVOTHROID) 300 MCG tablet Take 300 mcg by mouth daily before breakfast.      . lisinopril-hydrochlorothiazide (PRINZIDE,ZESTORETIC) 20-12.5 MG per tablet Take 1 tablet by mouth daily with breakfast.       . methadone (DOLOPHINE) 10 MG/ML solution Take 220 mg by mouth daily with breakfast.        No current facility-administered medications for this visit.    Review of Systems Review of Systems  Constitutional: Negative for fever, chills and unexpected weight change.       Takes methadone for chronic back pain - has pain contract  HENT: Negative for congestion, hearing loss, sore throat, trouble swallowing and voice change.   Eyes: Negative for visual disturbance.  Respiratory: Negative for cough, chest tightness and wheezing.   Cardiovascular: Negative for chest pain, palpitations and leg swelling.       +DOE; denies CP, SOB;  sleeps on 3 pillows - hard to breathe if lay flat; positive obstructive sleep apnea on sleep study 06/06/2013. Uses inhaler as needed; intermittent LE swelling - uses Lasix once a month.   Gastrointestinal: Negative for nausea, vomiting, abdominal pain, diarrhea, constipation, blood in stool and abdominal distention.       S/p lap chole. Has reflux. Has had several EGDs. Dr Ferdinand Lango at [facility] told her she had Barretts. Saw a Dr Ella Bodo (sp?), GI doctor at [facility], who did EGD and was told no reflux. Has to take lactulose secondary to constipation. Has regular BMs now. Reports h/o gastric ulcers  Endocrine: Negative for cold intolerance, heat intolerance and polyphagia.  Genitourinary: Negative for  dysuria, hematuria, vaginal bleeding, vaginal discharge, difficulty urinating and pelvic pain.       G3P3; irreg menses.   Musculoskeletal: Positive for neck pain. Negative for arthralgias.       Has had neck fusion and lower lumbar fusion. Has rt lateral thing numbness if sits too long - has CT myelogram scheduled. Lower back pain  Skin: Negative for rash and wound.  Neurological: Positive for headaches (occassional migraines). Negative for dizziness, seizures and syncope.       Denies TIA/amaruosis fugax  Hematological: Negative for adenopathy. Does not bruise/bleed easily.  Psychiatric/Behavioral: Negative for confusion.    Blood pressure 118/64, pulse 78, temperature 98.7 F (37.1 C), temperature source Oral, resp. rate 16, height _0  (1.626 m), weight 227 lb 6.4 oz (103.148 kg).  Physical Exam Physical Exam  Vitals reviewed. Constitutional: She is oriented to person, place, and time. She appears well-developed and well-nourished. No distress.  obese  HENT:  Head: Normocephalic and atraumatic.  Right Ear: External ear normal.  Left Ear: External ear normal.  Eyes: Conjunctivae are normal. No scleral icterus.  Neck: Normal range of motion. Neck supple. No tracheal  deviation present. No thyromegaly present.  Cardiovascular: Normal rate and normal heart sounds.   Pulmonary/Chest: Effort normal and breath sounds normal. No stridor. No respiratory distress. She has no wheezes.  Abdominal: Soft. She exhibits no distension. There is no tenderness. There is no rebound.  Musculoskeletal: She exhibits no edema and no tenderness.  Lymphadenopathy:    She has no cervical adenopathy.  Neurological: She is alert and oriented to person, place, and time. She exhibits normal muscle tone.  Skin: Skin is warm and dry. No rash noted. She is not diaphoretic. No erythema.  Psychiatric: She has a normal mood and affect. Her behavior is normal. Judgment and thought content normal.    Data Reviewed My office note 05/2013 [facility] sleep study 06/06/13 - severe OSA Bariatric evaluation labs - wnl, nml thyroid panel I had previously reviewed her upper endoscopy reports and outside pathology report UGI - wnl CXR -wnl CT abd pelvis 05/18/13 Epworth sleepiness scale score 18  Assessment    Obesity BMI 38.8 Gastroesophageal reflux disease Barrett's esophagus Hypertension Lumbar degenerative disc disease Hypothyroidism  Asthma Severe OSA     Plan    We reviewed her preoperative workup. I encouraged her and instructed her to contact her PCPs office to discuss her issues with her CPAP. She was reminded to bring her CPAP mask to the hospital. We also discussed the preoperative bowel prep consisting of MiraLAX. We also discussed the importance of ongoing compliance with the preoperative diet. Her weight at her visit in October 2014 was 231 pounds. We discussed the typical postoperative pain with this surgery. All of her questions were asked and answered.  Leighton Ruff. Redmond Pulling, MD, FACS General, Bariatric, & Minimally Invasive Surgery [facility], [place]         [facility] M 08/24/2013, 11:38 AM

## 2013-08-24 NOTE — Progress Notes (Signed)
Chest x-ray 05/29/13 on EPIC, EKG 06/16/13 on EPIC

## 2013-08-24 NOTE — Patient Instructions (Addendum)
Continue with the preop diet Do the bowel prep as instructed Follow up with your PCP office regarding your CPAP

## 2013-08-24 NOTE — Patient Instructions (Addendum)
20 Sharon Hughes  08/24/2013   Your procedure is scheduled on: 08/29/13  [facility] at 5:15 AM.  Call this number if you have problems the morning of surgery 336-: [phone]   Remember: please bring inhaler on day of surgery, please follow bowel prep instructions   Do not eat food or drink liquids After Midnight.     Take these medicines the morning of surgery with A SIP OF WATER: gabapentin, levothyroxine, methadone, albuterol   Do not wear jewelry, make-up or nail polish.  Do not wear lotions, powders, or perfumes. You may wear deodorant.  Do not shave 48 hours prior to surgery. Men may shave face and neck.  Do not bring valuables to the hospital.  Contacts, dentures or bridgework may not be worn into surgery.  Leave suitcase in the car. After surgery it may be brought to your room.  For patients admitted to the hospital, checkout time is 11:00 AM the day of discharge.    Sharon Sonsachel Donalyn Schneeberger, RN  pre op nurse call if needed [phone]    FAILURE TO FOLLOW THESE INSTRUCTIONS MAY RESULT IN CANCELLATION OF YOUR SURGERY   Patient Signature: ___________________________________________

## 2013-08-25 NOTE — Progress Notes (Signed)
Pre-Operative Nutrition Class:  Appt start time: 1730   End time:  1930  Patient was seen on 08/24/2013 for Pre-Operative Bariatric Surgery Education at [facility].   Surgery date: 08/29/2013 Surgery type: Gastric Bypass  The following the learning objectives were met by the patient during this course:  Identify Pre-Op Dietary Goals and will begin 2 weeks pre-operatively  Identify appropriate sources of fluids and proteins   State protein recommendations and appropriate sources pre and post-operatively  Identify Post-Operative Dietary Goals and will follow for 2 weeks post-operatively  Identify appropriate multivitamin and calcium sources  Describe the need for physical activity post-operatively and will follow MD recommendations  State when to call healthcare provider regarding medication questions or post-operative complications  Handouts given during class include:  Pre-Op Bariatric Surgery Diet Handout  Protein Shake Handout  Post-Op Bariatric Surgery Nutrition Handout  BELT Program Information Flyer  Support Group Information Flyer  [facility] Bariatric Supplements Price List  Follow-Up Plan: Patient will follow-up at [facility] 2 weeks post operatively for diet advancement per MD.

## 2013-08-29 ENCOUNTER — Encounter (HOSPITAL_COMMUNITY): Payer: 59 | Admitting: Anesthesiology

## 2013-08-29 ENCOUNTER — Encounter (HOSPITAL_COMMUNITY)
Admission: RE | Disposition: A | Discharge status: Home / Self Care | Payer: Self-pay | Source: Ambulatory Visit | Attending: General Surgery

## 2013-08-29 ENCOUNTER — Inpatient Hospital Stay (HOSPITAL_COMMUNITY)
Admission: RE | Admit: 2013-08-29 | Discharge: 2013-09-01 | DRG: 621 | Disposition: A | Discharge status: Home / Self Care | Payer: 59 | Source: Ambulatory Visit | Attending: General Surgery | Admitting: General Surgery

## 2013-08-29 ENCOUNTER — Inpatient Hospital Stay (HOSPITAL_COMMUNITY): Payer: 59 | Admitting: Anesthesiology

## 2013-08-29 ENCOUNTER — Encounter (HOSPITAL_COMMUNITY): Payer: Self-pay | Admitting: *Deleted

## 2013-08-29 DIAGNOSIS — Z9884 Bariatric surgery status: Secondary | ICD-10-CM

## 2013-08-29 DIAGNOSIS — E669 Obesity, unspecified: Secondary | ICD-10-CM | POA: Diagnosis present

## 2013-08-29 DIAGNOSIS — G4733 Obstructive sleep apnea (adult) (pediatric): Secondary | ICD-10-CM | POA: Diagnosis present

## 2013-08-29 DIAGNOSIS — Z9989 Dependence on other enabling machines and devices: Secondary | ICD-10-CM

## 2013-08-29 DIAGNOSIS — K2270 Barrett's esophagus without dysplasia: Secondary | ICD-10-CM

## 2013-08-29 DIAGNOSIS — M5137 Other intervertebral disc degeneration, lumbosacral region: Secondary | ICD-10-CM | POA: Diagnosis present

## 2013-08-29 DIAGNOSIS — M48062 Spinal stenosis, lumbar region with neurogenic claudication: Secondary | ICD-10-CM | POA: Diagnosis present

## 2013-08-29 DIAGNOSIS — J45909 Unspecified asthma, uncomplicated: Secondary | ICD-10-CM | POA: Diagnosis present

## 2013-08-29 DIAGNOSIS — I10 Essential (primary) hypertension: Secondary | ICD-10-CM | POA: Diagnosis present

## 2013-08-29 DIAGNOSIS — Z01812 Encounter for preprocedural laboratory examination: Secondary | ICD-10-CM

## 2013-08-29 DIAGNOSIS — K2100 Gastro-esophageal reflux disease with esophagitis, without bleeding: Secondary | ICD-10-CM

## 2013-08-29 DIAGNOSIS — Z823 Family history of stroke: Secondary | ICD-10-CM

## 2013-08-29 DIAGNOSIS — K219 Gastro-esophageal reflux disease without esophagitis: Secondary | ICD-10-CM | POA: Diagnosis present

## 2013-08-29 DIAGNOSIS — E039 Hypothyroidism, unspecified: Secondary | ICD-10-CM | POA: Diagnosis present

## 2013-08-29 DIAGNOSIS — F411 Generalized anxiety disorder: Secondary | ICD-10-CM | POA: Diagnosis present

## 2013-08-29 DIAGNOSIS — F1120 Opioid dependence, uncomplicated: Secondary | ICD-10-CM | POA: Diagnosis present

## 2013-08-29 DIAGNOSIS — Z6838 Body mass index (BMI) 38.0-38.9, adult: Secondary | ICD-10-CM

## 2013-08-29 DIAGNOSIS — M51379 Other intervertebral disc degeneration, lumbosacral region without mention of lumbar back pain or lower extremity pain: Secondary | ICD-10-CM | POA: Diagnosis present

## 2013-08-29 DIAGNOSIS — Z9089 Acquired absence of other organs: Secondary | ICD-10-CM

## 2013-08-29 DIAGNOSIS — G8929 Other chronic pain: Secondary | ICD-10-CM | POA: Diagnosis present

## 2013-08-29 DIAGNOSIS — Z87891 Personal history of nicotine dependence: Secondary | ICD-10-CM

## 2013-08-29 HISTORY — PX: GASTRIC ROUX-EN-Y: SHX5262

## 2013-08-29 LAB — HEMOGLOBIN AND HEMATOCRIT, BLOOD
HCT: 35.6 % — ABNORMAL LOW (ref 36.0–46.0)
Hemoglobin: 11.9 g/dL — ABNORMAL LOW (ref 12.0–15.0)

## 2013-08-29 SURGERY — LAPAROSCOPIC ROUX-EN-Y GASTRIC BYPASS WITH UPPER ENDOSCOPY
Anesthesia: General | Site: Abdomen

## 2013-08-29 MED ORDER — HYDROMORPHONE HCL PF 1 MG/ML IJ SOLN
0.2500 mg | INTRAMUSCULAR | Status: DC | PRN
  Administered 2013-08-29 (×6): 0.5 mg via INTRAVENOUS

## 2013-08-29 MED ORDER — UNJURY CHICKEN SOUP POWDER
2.0000 [oz_av] | Freq: Four times a day (QID) | ORAL | Status: DC
  Administered 2013-08-31 – 2013-09-01 (×3): 2 [oz_av] via ORAL

## 2013-08-29 MED ORDER — 0.9 % SODIUM CHLORIDE (POUR BTL) OPTIME
TOPICAL | Status: DC | PRN
  Administered 2013-08-29: 1000 mL

## 2013-08-29 MED ORDER — MORPHINE SULFATE 2 MG/ML IJ SOLN
2.0000 mg | INTRAMUSCULAR | Status: DC | PRN
  Administered 2013-08-29: 6 mg via INTRAVENOUS
  Administered 2013-08-29: 4 mg via INTRAVENOUS
  Administered 2013-08-29: 6 mg via INTRAVENOUS
  Administered 2013-08-29: 4 mg via INTRAVENOUS
  Administered 2013-08-29 – 2013-08-30 (×4): 6 mg via INTRAVENOUS
  Administered 2013-08-30: 4 mg via INTRAVENOUS
  Administered 2013-08-30: 6 mg via INTRAVENOUS
  Administered 2013-08-30: 4 mg via INTRAVENOUS
  Administered 2013-08-30 – 2013-08-31 (×6): 6 mg via INTRAVENOUS
  Administered 2013-08-31: 4 mg via INTRAVENOUS
  Administered 2013-08-31: 6 mg via INTRAVENOUS
  Administered 2013-09-01 (×2): 4 mg via INTRAVENOUS
  Filled 2013-08-29: qty 2
  Filled 2013-08-29 (×7): qty 3
  Filled 2013-08-29: qty 2
  Filled 2013-08-29 (×3): qty 3
  Filled 2013-08-29 (×2): qty 2
  Filled 2013-08-29: qty 3
  Filled 2013-08-29: qty 2
  Filled 2013-08-29 (×2): qty 3
  Filled 2013-08-29 (×2): qty 2
  Filled 2013-08-29: qty 3

## 2013-08-29 MED ORDER — MEPERIDINE HCL 50 MG/ML IJ SOLN: 6.2500 mg | INTRAMUSCULAR | Status: DC | PRN

## 2013-08-29 MED ORDER — MIDAZOLAM HCL 2 MG/2ML IJ SOLN
INTRAMUSCULAR | Status: AC
  Filled 2013-08-29: qty 2

## 2013-08-29 MED ORDER — ONDANSETRON HCL 4 MG/2ML IJ SOLN
INTRAMUSCULAR | Status: AC
  Filled 2013-08-29: qty 2

## 2013-08-29 MED ORDER — GLYCOPYRROLATE 0.2 MG/ML IJ SOLN
INTRAMUSCULAR | Status: DC | PRN
  Administered 2013-08-29: 0.6 mg via INTRAVENOUS

## 2013-08-29 MED ORDER — KETAMINE HCL 10 MG/ML IJ SOLN
INTRAMUSCULAR | Status: AC
  Filled 2013-08-29: qty 1

## 2013-08-29 MED ORDER — TISSEEL VH 10 ML EX KIT
PACK | CUTANEOUS | Status: AC
  Filled 2013-08-29: qty 2

## 2013-08-29 MED ORDER — EVICEL 5 ML EX KIT
PACK | CUTANEOUS | Status: DC | PRN
  Administered 2013-08-29: 2

## 2013-08-29 MED ORDER — CISATRACURIUM BESYLATE 20 MG/10ML IV SOLN
INTRAVENOUS | Status: AC
  Filled 2013-08-29: qty 10

## 2013-08-29 MED ORDER — KETAMINE HCL 10 MG/ML IJ SOLN
INTRAMUSCULAR | Status: DC | PRN
  Administered 2013-08-29: 40 mg via INTRAVENOUS

## 2013-08-29 MED ORDER — FENTANYL CITRATE 0.05 MG/ML IJ SOLN
INTRAMUSCULAR | Status: DC | PRN
  Administered 2013-08-29: 100 ug via INTRAVENOUS
  Administered 2013-08-29 (×3): 50 ug via INTRAVENOUS
  Administered 2013-08-29 (×2): 100 ug via INTRAVENOUS

## 2013-08-29 MED ORDER — UNJURY CHOCOLATE CLASSIC POWDER: 2.0000 [oz_av] | Freq: Four times a day (QID) | ORAL | Status: DC

## 2013-08-29 MED ORDER — PANTOPRAZOLE SODIUM 40 MG IV SOLR
40.0000 mg | INTRAVENOUS | Status: DC
  Administered 2013-08-29 – 2013-08-31 (×4): 40 mg via INTRAVENOUS
  Filled 2013-08-29 (×5): qty 40

## 2013-08-29 MED ORDER — ONDANSETRON HCL 4 MG/2ML IJ SOLN
INTRAMUSCULAR | Status: DC | PRN
  Administered 2013-08-29: 4 mg via INTRAVENOUS

## 2013-08-29 MED ORDER — PROPOFOL 10 MG/ML IV BOLUS
INTRAVENOUS | Status: AC
  Filled 2013-08-29: qty 20

## 2013-08-29 MED ORDER — ALBUTEROL SULFATE (2.5 MG/3ML) 0.083% IN NEBU: 3.0000 mL | INHALATION_SOLUTION | RESPIRATORY_TRACT | Status: DC | PRN

## 2013-08-29 MED ORDER — MIDAZOLAM HCL 5 MG/5ML IJ SOLN
INTRAMUSCULAR | Status: DC | PRN
  Administered 2013-08-29: 2 mg via INTRAVENOUS

## 2013-08-29 MED ORDER — HYDROMORPHONE HCL PF 1 MG/ML IJ SOLN
1.0000 mg | INTRAMUSCULAR | Status: DC | PRN
  Administered 2013-08-29 (×2): 2 mg via INTRAVENOUS
  Administered 2013-08-30: 1 mg via INTRAVENOUS
  Administered 2013-08-30 – 2013-08-31 (×16): 2 mg via INTRAVENOUS
  Administered 2013-09-01: 1 mg via INTRAVENOUS
  Administered 2013-09-01: 2 mg via INTRAVENOUS
  Administered 2013-09-01: 1 mg via INTRAVENOUS
  Administered 2013-09-01 (×3): 2 mg via INTRAVENOUS
  Filled 2013-08-29 (×13): qty 2
  Filled 2013-08-29: qty 1
  Filled 2013-08-29 (×11): qty 2

## 2013-08-29 MED ORDER — ENOXAPARIN SODIUM 40 MG/0.4ML ~~LOC~~ SOLN
40.0000 mg | Freq: Two times a day (BID) | SUBCUTANEOUS | Status: DC
  Administered 2013-08-30 – 2013-09-01 (×5): 40 mg via SUBCUTANEOUS
  Filled 2013-08-29 (×8): qty 0.4

## 2013-08-29 MED ORDER — CISATRACURIUM BESYLATE (PF) 10 MG/5ML IV SOLN
INTRAVENOUS | Status: DC | PRN
  Administered 2013-08-29: 3 mg via INTRAVENOUS
  Administered 2013-08-29: 4 mg via INTRAVENOUS
  Administered 2013-08-29: 6 mg via INTRAVENOUS
  Administered 2013-08-29: 4 mg via INTRAVENOUS
  Administered 2013-08-29: 2 mg via INTRAVENOUS

## 2013-08-29 MED ORDER — SUCCINYLCHOLINE CHLORIDE 20 MG/ML IJ SOLN
INTRAMUSCULAR | Status: DC | PRN
  Administered 2013-08-29: 100 mg via INTRAVENOUS

## 2013-08-29 MED ORDER — PROPOFOL 10 MG/ML IV BOLUS
INTRAVENOUS | Status: DC | PRN
Start: 2013-08-29 — End: 2013-08-29
  Administered 2013-08-29: 140 mg via INTRAVENOUS

## 2013-08-29 MED ORDER — KCL IN DEXTROSE-NACL 20-5-0.45 MEQ/L-%-% IV SOLN
INTRAVENOUS | Status: DC
  Administered 2013-08-29 – 2013-09-01 (×9): via INTRAVENOUS
  Filled 2013-08-29 (×12): qty 1000

## 2013-08-29 MED ORDER — ONDANSETRON HCL 4 MG/2ML IJ SOLN
4.0000 mg | INTRAMUSCULAR | Status: DC | PRN
  Administered 2013-08-29 – 2013-08-31 (×7): 4 mg via INTRAVENOUS
  Filled 2013-08-29 (×7): qty 2

## 2013-08-29 MED ORDER — HYDROMORPHONE HCL PF 1 MG/ML IJ SOLN
INTRAMUSCULAR | Status: AC
  Filled 2013-08-29: qty 1

## 2013-08-29 MED ORDER — FENTANYL CITRATE 0.05 MG/ML IJ SOLN
INTRAMUSCULAR | Status: AC
  Filled 2013-08-29: qty 5

## 2013-08-29 MED ORDER — LACTATED RINGERS IR SOLN
Status: DC | PRN
  Administered 2013-08-29: 3000 mL

## 2013-08-29 MED ORDER — HEPARIN SODIUM (PORCINE) 5000 UNIT/ML IJ SOLN
5000.0000 [IU] | INTRAMUSCULAR | Status: AC
  Administered 2013-08-29: 5000 [IU] via SUBCUTANEOUS
  Filled 2013-08-29: qty 1

## 2013-08-29 MED ORDER — ACETAMINOPHEN 10 MG/ML IV SOLN
1000.0000 mg | Freq: Once | INTRAVENOUS | Status: AC
  Administered 2013-08-29: 1000 mg via INTRAVENOUS
  Filled 2013-08-29: qty 100

## 2013-08-29 MED ORDER — BUPIVACAINE-EPINEPHRINE 0.25% -1:200000 IJ SOLN
INTRAMUSCULAR | Status: DC | PRN
  Administered 2013-08-29: 10 mL

## 2013-08-29 MED ORDER — ACETAMINOPHEN 10 MG/ML IV SOLN
1000.0000 mg | Freq: Four times a day (QID) | INTRAVENOUS | Status: AC
  Administered 2013-08-29 – 2013-08-30 (×4): 1000 mg via INTRAVENOUS
  Filled 2013-08-29 (×5): qty 100

## 2013-08-29 MED ORDER — LACTATED RINGERS IV SOLN
INTRAVENOUS | Status: DC | PRN
  Administered 2013-08-29 (×3): via INTRAVENOUS

## 2013-08-29 MED ORDER — OXYCODONE HCL 5 MG PO TABS: 5.0000 mg | ORAL_TABLET | Freq: Once | ORAL | Status: DC | PRN

## 2013-08-29 MED ORDER — PROMETHAZINE HCL 25 MG/ML IJ SOLN: 6.2500 mg | INTRAMUSCULAR | Status: DC | PRN

## 2013-08-29 MED ORDER — NEOSTIGMINE METHYLSULFATE 1 MG/ML IJ SOLN
INTRAMUSCULAR | Status: DC | PRN
  Administered 2013-08-29: 4 mg via INTRAVENOUS

## 2013-08-29 MED ORDER — LACTATED RINGERS IV SOLN: INTRAVENOUS | Status: DC

## 2013-08-29 MED ORDER — DEXAMETHASONE SODIUM PHOSPHATE 10 MG/ML IJ SOLN
INTRAMUSCULAR | Status: DC | PRN
  Administered 2013-08-29: 10 mg via INTRAVENOUS

## 2013-08-29 MED ORDER — DEXTROSE 5 % IV SOLN
2.0000 g | INTRAVENOUS | Status: AC
  Administered 2013-08-29: 2 g via INTRAVENOUS
  Filled 2013-08-29: qty 2

## 2013-08-29 MED ORDER — OXYCODONE-ACETAMINOPHEN 5-325 MG/5ML PO SOLN: 5.0000 mL | ORAL | Status: DC | PRN

## 2013-08-29 MED ORDER — NEOSTIGMINE METHYLSULFATE 1 MG/ML IJ SOLN
INTRAMUSCULAR | Status: AC
  Filled 2013-08-29: qty 10

## 2013-08-29 MED ORDER — UNJURY VANILLA POWDER: 2.0000 [oz_av] | Freq: Four times a day (QID) | ORAL | Status: DC

## 2013-08-29 MED ORDER — ACETAMINOPHEN 160 MG/5ML PO SOLN: 650.0000 mg | ORAL | Status: DC | PRN

## 2013-08-29 MED ORDER — DEXAMETHASONE SODIUM PHOSPHATE 10 MG/ML IJ SOLN
INTRAMUSCULAR | Status: AC
  Filled 2013-08-29: qty 1

## 2013-08-29 MED ORDER — MORPHINE SULFATE 10 MG/ML IJ SOLN
INTRAMUSCULAR | Status: AC
  Administered 2013-08-29: 2 mg via INTRAVENOUS
  Filled 2013-08-29: qty 1

## 2013-08-29 MED ORDER — BUPIVACAINE-EPINEPHRINE 0.25% -1:200000 IJ SOLN
INTRAMUSCULAR | Status: AC
  Filled 2013-08-29: qty 1

## 2013-08-29 MED ORDER — OXYCODONE HCL 5 MG/5ML PO SOLN
5.0000 mg | Freq: Once | ORAL | Status: DC | PRN
  Filled 2013-08-29: qty 5

## 2013-08-29 MED ORDER — MORPHINE SULFATE 10 MG/ML IJ SOLN
2.0000 mg | INTRAMUSCULAR | Status: DC | PRN
  Administered 2013-08-29: 4 mg via INTRAVENOUS
  Administered 2013-08-29 (×3): 2 mg via INTRAVENOUS
  Filled 2013-08-29: qty 1

## 2013-08-29 MED ORDER — GLYCOPYRROLATE 0.2 MG/ML IJ SOLN
INTRAMUSCULAR | Status: AC
  Filled 2013-08-29: qty 3

## 2013-08-29 SURGICAL SUPPLY — 59 items
APPLICATOR COTTON TIP 6IN STRL (MISCELLANEOUS) IMPLANT
BLADE SURG 15 STRL LF DISP TIS (BLADE) IMPLANT
BLADE SURG 15 STRL SS (BLADE)
BLADE SURG SZ11 CARB STEEL (BLADE) ×3 IMPLANT
CABLE HIGH FREQUENCY MONO STRZ (ELECTRODE) IMPLANT
CHLORAPREP W/TINT 26ML (MISCELLANEOUS) ×6 IMPLANT
CLIP SUT LAPRA TY ABSORB (SUTURE) ×9 IMPLANT
CUTTER LINEAR ENDO ART 45 ETS (STAPLE) ×3 IMPLANT
DERMABOND ADVANCED (GAUZE/BANDAGES/DRESSINGS) ×4
DERMABOND ADVANCED .7 DNX12 (GAUZE/BANDAGES/DRESSINGS) ×2 IMPLANT
DEVICE SUTURE ENDOST 10MM (ENDOMECHANICALS) ×3 IMPLANT
DRAIN PENROSE 18X1/4 LTX STRL (WOUND CARE) ×3 IMPLANT
DRAPE CAMERA CLOSED 9X96 (DRAPES) ×3 IMPLANT
ELECT REM PT RETURN 9FT ADLT (ELECTROSURGICAL) ×3
ELECTRODE REM PT RTRN 9FT ADLT (ELECTROSURGICAL) ×1 IMPLANT
GAUZE SPONGE 4X4 16PLY XRAY LF (GAUZE/BANDAGES/DRESSINGS) ×3 IMPLANT
GLOVE BIOGEL M STRL SZ7.5 (GLOVE) ×3 IMPLANT
GOWN STRL REUS W/TWL XL LVL3 (GOWN DISPOSABLE) ×12 IMPLANT
HOLDER FOLEY CATH W/STRAP (MISCELLANEOUS) ×3 IMPLANT
HOVERMATT SINGLE USE (MISCELLANEOUS) ×3 IMPLANT
KIT BASIN OR (CUSTOM PROCEDURE TRAY) ×3 IMPLANT
KIT GASTRIC LAVAGE 34FR ADT (SET/KITS/TRAYS/PACK) ×3 IMPLANT
MARKER SKIN DUAL TIP RULER LAB (MISCELLANEOUS) ×3 IMPLANT
NEEDLE SPNL 22GX3.5 QUINCKE BK (NEEDLE) ×3 IMPLANT
PACK CARDIOVASCULAR III (CUSTOM PROCEDURE TRAY) ×3 IMPLANT
RELOAD 45 VASCULAR/THIN (ENDOMECHANICALS) ×3 IMPLANT
RELOAD BLUE (STAPLE) ×9 IMPLANT
RELOAD ENDO STITCH 2.0 (ENDOMECHANICALS) ×14
RELOAD GOLD (STAPLE) ×3 IMPLANT
RELOAD STAPLE TA45 3.5 REG BLU (ENDOMECHANICALS) ×3 IMPLANT
RELOAD WHITE ECR60W (STAPLE) ×3 IMPLANT
SCISSORS LAP 5X35 DISP (ENDOMECHANICALS) ×3 IMPLANT
SEALANT SURGICAL APPL DUAL CAN (MISCELLANEOUS) ×6 IMPLANT
SET IRRIG TUBING LAPAROSCOPIC (IRRIGATION / IRRIGATOR) ×3 IMPLANT
SHEARS HARMONIC ACE PLUS 45CM (MISCELLANEOUS) ×3 IMPLANT
SLEEVE ADV FIXATION 12X100MM (TROCAR) ×6 IMPLANT
SOLUTION ANTI FOG 6CC (MISCELLANEOUS) ×3 IMPLANT
SPONGE GAUZE 4X4 12PLY (GAUZE/BANDAGES/DRESSINGS) IMPLANT
STAPLE ECHEON FLEX 60 POW ENDO (STAPLE) ×3 IMPLANT
STAPLER VISISTAT 35W (STAPLE) IMPLANT
SUT DVC SILK 2.0X39 (SUTURE) ×12 IMPLANT
SUT DVC VICRYL PGA 2.0X39 (SUTURE) ×3 IMPLANT
SUT MNCRL AB 4-0 PS2 18 (SUTURE) ×3 IMPLANT
SUT RELOAD ENDO STITCH 2 48X1 (ENDOMECHANICALS) ×7
SUT RELOAD ENDO STITCH 2.0 (ENDOMECHANICALS)
SUT VIC AB 2-0 SH 27 (SUTURE) ×2
SUT VIC AB 2-0 SH 27X BRD (SUTURE) ×1 IMPLANT
SUTURE RELOAD END STTCH 2 48X1 (ENDOMECHANICALS) ×7 IMPLANT
SUTURE RELOAD ENDO STITCH 2.0 (ENDOMECHANICALS) IMPLANT
SYR 20CC LL (SYRINGE) ×6 IMPLANT
TOWEL OR 17X26 10 PK STRL BLUE (TOWEL DISPOSABLE) ×3 IMPLANT
TOWEL OR NON WOVEN STRL DISP B (DISPOSABLE) ×3 IMPLANT
TRAY FOLEY CATH 14FRSI W/METER (CATHETERS) ×3 IMPLANT
TROCAR ADV FIXATION 12X100MM (TROCAR) ×3 IMPLANT
TROCAR ADV FIXATION 5X100MM (TROCAR) ×3 IMPLANT
TROCAR BLADELESS OPT 5 100 (ENDOMECHANICALS) ×3 IMPLANT
TROCAR XCEL 12X100 BLDLESS (ENDOMECHANICALS) ×3 IMPLANT
TUBING ENDO SMARTCAP PENTAX (MISCELLANEOUS) ×3 IMPLANT
TUBING FILTER THERMOFLATOR (ELECTROSURGICAL) ×3 IMPLANT

## 2013-08-29 NOTE — Transfer of Care (Signed)
Immediate Anesthesia Transfer of Care Note  Patient: Sharon Hughes  Procedure(s) Performed: Procedure(s): LAPAROSCOPIC ROUX-EN-Y GASTRIC BYPASS WITH UPPER ENDOSCOPY (N/A)  Patient Location: PACU  Anesthesia Type:General  Level of Consciousness: awake and alert   Airway & Oxygen Therapy: Patient Spontanous Breathing and Patient connected to face mask oxygen  Post-op Assessment: Report given to PACU RN and Post -op Vital signs reviewed and stable  Post vital signs: Reviewed and stable  Complications: No apparent anesthesia complications

## 2013-08-29 NOTE — H&P (View-Only) (Signed)
Patient ID: Sharon Hughes, female   DOB: 04-01-1966, 48 y.o.   MRN: [id number]  Chief Complaint  Patient presents with  . Bariatric Pre-op    Pre op for RNY 08/29/13    HPI Sharon Hughes is a 48 y.o. female.   HPI 48 year old obese Caucasian female comes in today for her preoperative appointment. She is currently scheduled to undergo laparoscopic Roux-en-Y gastric bypass on January 13. I initially met the patient on October 10. At that time she was interested and sleeve gastrectomy but after reviewing her outside endoscopy reports and pathology reports I contacted the patient and recommended avoiding a sleeve gastrectomy because of her severe reflux and Barrett's esophagus history. She states that she has done additional reading and research on gastric bypass and believes this is the correct procedure for her. She did end up having the sleep study at [facility] and was found to have severe sleep apnea. She states she is using it however she is still not sleeping well. She is waking up every 2 hours. She states that her mouth is very dry. Otherwise she denies any changes to her medical history. She does have a pain contract with [facility]. She states that she was told by their office they would contact me on Monday to discuss her perioperative pain management control.   05/26/13: 48 yo obese WF referred by Dr Sharon Hughes at [facility] for evaluation of weight loss surgery. The patient states that she has struggled with her weight pretty much her entire adult life. She has tried numerous attempts for sustained weight loss. She has tried Weight Watchers, Atkins diet, Slim fast, Fen/Phen, ephedrine, and phentermine. All of which without any long-term success. Her comorbidities include hypertension, gastroesophageal reflux disease, possible Barrett's esophagus, cervical and lumbar degenerative disease, hypothyroidism, and probable obstructive sleep apnea. She states that she  has an upcoming sleep study next week.  She states that she is interested in a sleeve gastrectomy because she is on a lot of research on it and feels that would be the best procedure for her. She is concerned about having a laparoscopic adjustable gastric band procedure because the people she knows that have had that procedure have experienced weight regain. Past Medical History  Diagnosis Date  . Hypertension     takes Lisinopril prn  . Asthma     Advair daily and Albuterol prn  . Pneumonia     hx of--2009  . Bronchitis 2011  . Seasonal allergies   . Headache(784.0)     occasionally  . Chronic back pain     degenerative disc and stenosis  . Arthritis   . Eczema     hx of--no problems in 21yr   . Barrett's esophagus     takes Tagamet daily  . Ulcer     unsure if gastric/peptic/duodenal but cleared up with carafate  . 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 Hemorrhoids     banded  . UTI (lower urinary tract infection)     finished up antibiotics last week   . Hypothyroidism     takes Levothyroxine daily  . Gastric ulcer   . Degenerative disc disease     Past Surgical History  Procedure Laterality Date  . Lumbar laminectomy  06/14/2009  . Cervical fusion    . Cholecystectomy    . Cesarean section      x 3  . Tubal ligation  with one of the c/s  . Tubal reversed    . Esophagogastroduodenoscopy    . Colonoscopy    . Anterior lat lumbar fusion  01/04/2012    Procedure: ANTERIOR LATERAL LUMBAR FUSION 1 LEVEL;  Surgeon: Sharon Pitter, MD;  Location: [facility];  Service: Neurosurgery;  Laterality: Left;  ANTERIOR LATERAL LUMBAR THREE-FOUR EXTREME LUMBAR INTERBODY FUSION/Lateral plating    Family History  Problem Relation Age of Onset  . Anesthesia problems Neg Hx   . Hypotension Neg Hx   . Malignant hyperthermia Neg Hx   . Pseudochol deficiency Neg Hx   . Stroke Maternal Grandmother     Social History History  Substance Use  Topics  . Smoking status: Former Smoker -- 0.25 packs/day for 3 years    Types: Cigarettes    Quit date: 09/18/2012  . Smokeless tobacco: Not on file  . Alcohol Use: No    Allergies  Allergen Reactions  . Ativan [Lorazepam] Other (See Comments)    Aggravates restless leg syndrome  . Other Other (See Comments)    Melons cause bumps and swelling of the throat  . Toradol [Ketorolac Tromethamine] Other (See Comments)    Aggravates restless leg syndrome  . Vistaril [Hydroxyzine Hcl] Other (See Comments)    Aggravates restless leg syndrome    Current Outpatient Prescriptions  Medication Sig Dispense Refill  . albuterol (PROVENTIL HFA;VENTOLIN HFA) 108 (90 BASE) MCG/ACT inhaler Inhale 2 puffs into the lungs every 4 (four) hours as needed for wheezing or shortness of breath.       . gabapentin (NEURONTIN) 600 MG tablet Take 600 mg by mouth 4 (four) times daily as needed (pain).       Marland Kitchen lactulose (CHRONULAC) 10 GM/15ML solution Take 30 mLs by mouth 2 (two) times daily as needed for mild constipation or moderate constipation.       Marland Kitchen levothyroxine (SYNTHROID, LEVOTHROID) 300 MCG tablet Take 300 mcg by mouth daily before breakfast.      . lisinopril-hydrochlorothiazide (PRINZIDE,ZESTORETIC) 20-12.5 MG per tablet Take 1 tablet by mouth daily with breakfast.       . methadone (DOLOPHINE) 10 MG/ML solution Take 220 mg by mouth daily with breakfast.        No current facility-administered medications for this visit.    Review of Systems Review of Systems  Constitutional: Negative for fever, chills and unexpected weight change.       Takes methadone for chronic back pain - has pain contract  HENT: Negative for congestion, hearing loss, sore throat, trouble swallowing and voice change.   Eyes: Negative for visual disturbance.  Respiratory: Negative for cough, chest tightness and wheezing.   Cardiovascular: Negative for chest pain, palpitations and leg swelling.       +DOE; denies CP, SOB;  sleeps on 3 pillows - hard to breathe if lay flat; positive obstructive sleep apnea on sleep study 06/06/2013. Uses inhaler as needed; intermittent LE swelling - uses Lasix once a month.   Gastrointestinal: Negative for nausea, vomiting, abdominal pain, diarrhea, constipation, blood in stool and abdominal distention.       S/p lap chole. Has reflux. Has had several EGDs. Dr Ferdinand Lango at [facility] told her she had Barretts. Saw a Dr Ella Bodo (sp?), GI doctor at [facility], who did EGD and was told no reflux. Has to take lactulose secondary to constipation. Has regular BMs now. Reports h/o gastric ulcers  Endocrine: Negative for cold intolerance, heat intolerance and polyphagia.  Genitourinary: Negative for  dysuria, hematuria, vaginal bleeding, vaginal discharge, difficulty urinating and pelvic pain.       G3P3; irreg menses.   Musculoskeletal: Positive for neck pain. Negative for arthralgias.       Has had neck fusion and lower lumbar fusion. Has rt lateral thing numbness if sits too long - has CT myelogram scheduled. Lower back pain  Skin: Negative for rash and wound.  Neurological: Positive for headaches (occassional migraines). Negative for dizziness, seizures and syncope.       Denies TIA/amaruosis fugax  Hematological: Negative for adenopathy. Does not bruise/bleed easily.  Psychiatric/Behavioral: Negative for confusion.    Blood pressure 118/64, pulse 78, temperature 98.7 F (37.1 C), temperature source Oral, resp. rate 16, height _0  (1.626 m), weight 227 lb 6.4 oz (103.148 kg).  Physical Exam Physical Exam  Vitals reviewed. Constitutional: She is oriented to person, place, and time. She appears well-developed and well-nourished. No distress.  obese  HENT:  Head: Normocephalic and atraumatic.  Right Ear: External ear normal.  Left Ear: External ear normal.  Eyes: Conjunctivae are normal. No scleral icterus.  Neck: Normal range of motion. Neck supple. No tracheal  deviation present. No thyromegaly present.  Cardiovascular: Normal rate and normal heart sounds.   Pulmonary/Chest: Effort normal and breath sounds normal. No stridor. No respiratory distress. She has no wheezes.  Abdominal: Soft. She exhibits no distension. There is no tenderness. There is no rebound.  Musculoskeletal: She exhibits no edema and no tenderness.  Lymphadenopathy:    She has no cervical adenopathy.  Neurological: She is alert and oriented to person, place, and time. She exhibits normal muscle tone.  Skin: Skin is warm and dry. No rash noted. She is not diaphoretic. No erythema.  Psychiatric: She has a normal mood and affect. Her behavior is normal. Judgment and thought content normal.    Data Reviewed My office note 05/2013 [facility] sleep study 06/06/13 - severe OSA Bariatric evaluation labs - wnl, nml thyroid panel I had previously reviewed her upper endoscopy reports and outside pathology report UGI - wnl CXR -wnl CT abd pelvis 05/18/13 Epworth sleepiness scale score 18  Assessment    Obesity BMI 38.8 Gastroesophageal reflux disease Barrett's esophagus Hypertension Lumbar degenerative disc disease Hypothyroidism  Asthma Severe OSA     Plan    We reviewed her preoperative workup. I encouraged her and instructed her to contact her PCPs office to discuss her issues with her CPAP. She was reminded to bring her CPAP mask to the hospital. We also discussed the preoperative bowel prep consisting of MiraLAX. We also discussed the importance of ongoing compliance with the preoperative diet. Her weight at her visit in October 2014 was 231 pounds. We discussed the typical postoperative pain with this surgery. All of her questions were asked and answered.  Sharon Ruff. Redmond Pulling, MD, FACS General, Bariatric, & Minimally Invasive Surgery [facility], [place]         [facility] M 08/24/2013, 11:38 AM

## 2013-08-29 NOTE — Progress Notes (Signed)
Pt placed on Auto CPAP 5-15 CMH20 with 2 LPM O2 bleed in via FFM.  Pt tolerating well at this time, RT to monitor and assess as needed.  

## 2013-08-29 NOTE — Interval H&P Note (Signed)
History and Physical Interval Note:  08/29/2013 7:17 AM  Sharon Hughes  has presented today for surgery, with the diagnosis of morbid obesity   The various methods of treatment have been discussed with the patient and family. After consideration of risks, benefits and other options for treatment, the patient has consented to  Procedure(s): LAPAROSCOPIC ROUX-EN-Y GASTRIC BYPASS WITH UPPER ENDOSCOPY (N/A) as a surgical intervention .  The patient's history has been reviewed, patient examined, no change in status, stable for surgery.  I have reviewed the patient's chart and labs.  Questions were answered to the patient's satisfaction.    Mary SellaEric Hughes. Andrey CampanileWilson, MD, FACS General, Bariatric, & Minimally Invasive Surgery [facility] [place]   [facility] Center[facility]IL[facility],Sharon Hughes

## 2013-08-29 NOTE — Op Note (Signed)
Sharon Hughes [id number] 06/04/1966. 08/29/2013  Preoperative diagnosis:  Obesity BMI 38.6 Gastroesophageal reflux disease  Barrett's esophagus  Hypertension  Lumbar degenerative disc disease  Hypothyroidism  Asthma  Severe OSA  Postoperative  diagnosis:  1. same  Surgical procedure: Laparoscopic Roux-en-Y gastric bypass (ante-colic, ante-gastric); upper endoscopy  Surgeon: Atilano Ina, M.D. FACS  Asst.: Luretha Murphy, MD FACS  Anesthesia: General plus 0.25% marcaine with epi  Complications: None   EBL: Minimal   Drains: None   Disposition: PACU in good condition   Indications for procedure:  47yo WF with morbid obesity who has been unsuccessful at sustained weight loss. The patient's comorbidities are listed above. We discussed the risk and benefits of surgery including but not limited to anesthesia risk, bleeding, infection, blood clot formation, anastomotic leak, anastomotic stricture, ulcer formation, death, respiratory complications, intestinal blockage, internal hernia, gallstone formation, vitamin and nutritional deficiencies, injury to surrounding structures, failure to lose weight and mood changes.   Description of procedure: Patient is brought to the operating room and general anesthesia induced. The patient had received preoperative broad-spectrum IV antibiotics and subcutaneous heparin. The abdomen was widely sterilely prepped with Chloraprep and draped. Patient timeout was performed and correct patient and procedure confirmed. Access was obtained with a 12 mm Optiview trocar in the left upper quadrant and pneumoperitoneum established without difficulty. Under direct vision 12 mm trocars were placed laterally in the right upper quadrant, right upper quadrant midclavicular line, and to the left and above the umbilicus for the camera port. A 5 mm trocar was placed laterally in the left upper quadrant.  The omentum was brought into the upper abdomen and the transverse  mesocolon elevated and the ligament of Treitz clearly identified. A 40 cm biliopancreatic limb was then carefully measured from the ligament of Treitz. The small intestine was divided at this point with a single firing of the white load linear stapler. A Penrose drain was sutured to the end of the Roux-en-Y limb for later identification. A 100 cm Roux-en-Y limb was then carefully measured. At this point a side-to-side anastomosis was created between the Roux limb and the end of the biliopancreatic limb. This was accomplished with a single firing of the 45 mm white load linear stapler. The common enterotomy was closed with a running 2-0 Vicryl begun at either end of the enterotomy and tied centrally. 2 additional interrupted 2-0 vicryl sutures were used to reinforce the closure of the common enterotomy.  Tisseel tissue sealant was placed over the anastomosis. The mesenteric defect was then closed with running 2-0 silk. The omentum was then divided with the harmonic scalpel up towards the transverse colon to allow mobility of the Roux limb toward the gastric pouch. The patient was then placed in steep reversed Trendelenburg. Through a 5 mm subxiphoid site the [facility] retractor was placed and the left lobe of the liver elevated with excellent exposure of the upper stomach and hiatus. The angle of Hiss was then mobilized with the harmonic scalpel. A 5 cm gastric pouch was then carefully measured along the lesser curve of the stomach. Dissection was carried along the lesser curve at this point with the Harmonic scalpel working carefully back toward the lesser sac at right angles to the lesser curve. The free lesser sac was then entered. After being sure all tubes were removed from the stomach an initial firing of the gold load 60 mm linear stapler was fired at right angles across the lesser curve for about 4 cm. The  gastric pouch was further mobilized posteriorly and then the pouch was completed with 4 further firings  of the 60 mm blue load linear stapler up through the previously dissected angle of His. It was ensured that the pouch was completely mobilized away from the gastric remnant. This created a nice tubular 4-5 cm gastric pouch.  The Roux limb was then brought up in an antecolic fashion with the candycane facing to the patient's left without undue tension. The gastrojejunostomy was created with an initial posterior row of 2-0 Vicryl between the Roux limb and the staple line of the gastric pouch. Enterotomies were then made in the gastric pouch and the Roux limb with the harmonic scalpel and at approximately 2-2-1/2 cm anastomosis was created with a single firing of the 45mm blue load linear stapler. The staple line was inspected and was intact without bleeding. The common enterotomy was then closed with running 2-0 Vicryl begun at either end and tied centrally. The Ewall tube was then easily passed through the anastomosis and an outer anterior layer of running 2-0 Vicryl was placed. The Ewald tube was removed. With the outlet of the gastrojejunostomy clamped and under saline irrigation the assistant performed upper endoscopy and with the gastric pouch tensely distended with air-there was no evidence of leak on this test. The pouch was desufflated. The Vonita Mosseterson defect was closed with running 2-0 silk. The abdomen was inspected for any evidence of bleeding or bowel injury and everything looked fine. The Nathanson retractor was removed under direct vision after coating the anastomosis with Tisseel tissue sealant. All CO2 was evacuated and trochars removed. Skin incisions were closed with 4-0 monocryl in a subcuticular fashion followed by Dermabond. Sponge needle and instrument counts were correct. The patient was taken to the PACU in good condition.    Sharon SellaEric M. Andrey CampanileWilson, MD, FACS General, Bariatric, & Minimally Invasive Surgery [facility], [place]

## 2013-08-29 NOTE — Anesthesia Preprocedure Evaluation (Addendum)
Anesthesia Evaluation  Patient identified by MRN, date of birth, ID band Patient awake    Reviewed: Allergy & Precautions, H&P , NPO status , Patient's Chart, lab work & pertinent test results, reviewed documented beta blocker date and time   Airway Mallampati: I TM Distance: <3 FB     Dental  (+) Teeth Intact   Pulmonary asthma , sleep apnea , pneumonia -, former smoker,  breath sounds clear to auscultation        Cardiovascular hypertension, Pt. on medications Rhythm:Regular Rate:Normal     Neuro/Psych  Headaches, Anxiety    GI/Hepatic Neg liver ROS, PUD,   Endo/Other  Hypothyroidism Morbid obesity  Renal/GU negative Renal ROS     Musculoskeletal negative musculoskeletal ROS (+)   Abdominal (+)  Abdomen: soft. Bowel sounds: normal.  Peds  Hematology negative hematology ROS (+)   Anesthesia Other Findings   Reproductive/Obstetrics negative OB ROS                         Anesthesia Physical  Anesthesia Plan  ASA: III  Anesthesia Plan: General   Post-op Pain Management:    Induction: Intravenous  Airway Management Planned: Oral ETT  Additional Equipment:   Intra-op Plan:   Post-operative Plan: Extubation in OR  Informed Consent: I have reviewed the patients History and Physical, chart, labs and discussed the procedure including the risks, benefits and alternatives for the proposed anesthesia with the patient or authorized representative who has indicated his/her understanding and acceptance.   Dental advisory given  Plan Discussed with: CRNA, Anesthesiologist and Surgeon  Anesthesia Plan Comments:       Anesthesia Quick Evaluation

## 2013-08-29 NOTE — Anesthesia Postprocedure Evaluation (Signed)
Anesthesia Post Note  Patient: Sharon Hughes  Procedure(s) Performed: Procedure(s) (LRB): LAPAROSCOPIC ROUX-EN-Y GASTRIC BYPASS WITH UPPER ENDOSCOPY (N/A)  Anesthesia type: General  Patient location: PACU  Post pain: Pain level controlled  Post assessment: Post-op Vital signs reviewed  Last Vitals: BP 122/71  Pulse 89  Temp(Src) 37.1 C (Oral)  Resp 12  Ht 5\' 4"  (1.626 m)  Wt 224 lb 8 oz (101.833 kg)  BMI 38.52 kg/m2  SpO2 95%  Post vital signs: Reviewed  Level of consciousness: sedated  Complications: No apparent anesthesia complications

## 2013-08-30 ENCOUNTER — Observation Stay (HOSPITAL_COMMUNITY): Payer: 59

## 2013-08-30 ENCOUNTER — Encounter (HOSPITAL_COMMUNITY): Payer: Self-pay | Admitting: General Surgery

## 2013-08-30 LAB — COMPREHENSIVE METABOLIC PANEL
ALBUMIN: 3.7 g/dL (ref 3.5–5.2)
ALK PHOS: 38 U/L — AB (ref 39–117)
ALT: 62 U/L — AB (ref 0–35)
AST: 43 U/L — AB (ref 0–37)
BUN: 8 mg/dL (ref 6–23)
CALCIUM: 9.2 mg/dL (ref 8.4–10.5)
CO2: 26 mEq/L (ref 19–32)
Chloride: 102 mEq/L (ref 96–112)
Creatinine, Ser: 0.83 mg/dL (ref 0.50–1.10)
GFR calc Af Amer: >90 mL/min (ref >90)
GFR calc non Af Amer: 83 mL/min — ABNORMAL LOW (ref >90)
Glucose, Bld: 142 mg/dL — ABNORMAL HIGH (ref 70–99)
POTASSIUM: 4.9 meq/L (ref 3.7–5.3)
SODIUM: 137 meq/L (ref 137–147)
Total Bilirubin: 0.2 mg/dL — ABNORMAL LOW (ref 0.3–1.2)
Total Protein: 7.1 g/dL (ref 6.0–8.3)

## 2013-08-30 LAB — CBC WITH DIFFERENTIAL/PLATELET
BASOS ABS: 0 10*3/uL (ref 0.0–0.1)
BASOS PCT: 0 % (ref 0–1)
EOS ABS: 0 10*3/uL (ref 0.0–0.7)
EOS PCT: 0 % (ref 0–5)
HCT: 34.9 % — ABNORMAL LOW (ref 36.0–46.0)
Hemoglobin: 11.5 g/dL — ABNORMAL LOW (ref 12.0–15.0)
LYMPHS ABS: 1.6 10*3/uL (ref 0.7–4.0)
Lymphocytes Relative: 12 % (ref 12–46)
MCH: 30.3 pg (ref 26.0–34.0)
MCHC: 33 g/dL (ref 30.0–36.0)
MCV: 92.1 fL (ref 78.0–100.0)
Monocytes Absolute: 1.4 10*3/uL — ABNORMAL HIGH (ref 0.1–1.0)
Monocytes Relative: 11 % (ref 3–12)
NEUTROS PCT: 78 % — AB (ref 43–77)
Neutro Abs: 10.4 10*3/uL — ABNORMAL HIGH (ref 1.7–7.7)
PLATELETS: 318 10*3/uL (ref 150–400)
RBC: 3.79 MIL/uL — ABNORMAL LOW (ref 3.87–5.11)
RDW: 13 % (ref 11.5–15.5)
WBC: 13.4 10*3/uL — ABNORMAL HIGH (ref 4.0–10.5)

## 2013-08-30 LAB — HEMOGLOBIN AND HEMATOCRIT, BLOOD
HCT: 33.2 % — ABNORMAL LOW (ref 36.0–46.0)
Hemoglobin: 11.1 g/dL — ABNORMAL LOW (ref 12.0–15.0)

## 2013-08-30 MED ORDER — OXYCODONE HCL 5 MG/5ML PO SOLN
5.0000 mg | ORAL | Status: DC | PRN
  Administered 2013-08-30 – 2013-08-31 (×7): 10 mg via ORAL
  Filled 2013-08-30 (×8): qty 10

## 2013-08-30 MED ORDER — IOHEXOL 300 MG/ML  SOLN
50.0000 mL | Freq: Once | INTRAMUSCULAR | Status: AC | PRN
  Administered 2013-08-30: 40 mL via ORAL

## 2013-08-30 MED ORDER — SIMETHICONE 40 MG/0.6ML PO SUSP
40.0000 mg | Freq: Four times a day (QID) | ORAL | Status: DC | PRN
  Filled 2013-08-30: qty 0.6

## 2013-08-30 MED ORDER — GABAPENTIN 300 MG PO CAPS
600.0000 mg | ORAL_CAPSULE | Freq: Four times a day (QID) | ORAL | Status: DC | PRN
  Filled 2013-08-30: qty 2

## 2013-08-30 MED ORDER — LEVOTHYROXINE SODIUM 150 MCG PO TABS
300.0000 ug | ORAL_TABLET | Freq: Every day | ORAL | Status: DC
  Administered 2013-08-31 – 2013-09-01 (×2): 300 ug via ORAL
  Filled 2013-08-30 (×3): qty 2

## 2013-08-30 MED ORDER — METHADONE HCL 10 MG/ML PO CONC
110.0000 mg | Freq: Every day | ORAL | Status: DC
  Administered 2013-08-30 – 2013-09-01 (×3): 110 mg via ORAL
  Filled 2013-08-30 (×2): qty 11

## 2013-08-30 NOTE — Progress Notes (Signed)
RT called to PT room for CPAP issue. PT stated the unit suddenly stopped (has no other problems with unit prior to this issue). RT examined unit and observed that the electrical cord was not connected. Once power was reconnected, settings reviewed, and PT reconnected via FFM- PT appears to be fine and states she is breathing well. PT settings Auto CPAP (Max 15cm, Min 5cm, no 02). Current Sp02 97%, RR 16- RN aware.

## 2013-08-30 NOTE — Progress Notes (Signed)
Pt currently on CPAP and tolerating well at this time.  Rt to monitor and assess as needed.

## 2013-08-30 NOTE — Progress Notes (Signed)
1 Day Post-Op  Subjective: Pain much better controlled. A little nausea but better. Slept ok  Objective: Vital signs in last 24 hours: Temp:  [98.1 F (36.7 C)-99.9 F (37.7 C)] 98.5 F (36.9 C) (01/14 0619) Pulse Rate:  [80-104] 80 (01/14 0619) Resp:  [9-21] 18 (01/14 0619) BP: (107-182)/(58-90) 107/67 mmHg (01/14 0619) SpO2:  [92 %-100 %] 97 % (01/14 0619) Weight:  [228 lb 13.4 oz (103.8 kg)] 228 lb 13.4 oz (103.8 kg) (01/14 0500) Last BM Date: 08/28/13  Intake/Output from previous day: 01/13 0701 - 01/14 0700 In: 5241.7 [I.V.:5041.7; IV Piggyback:200] Out: 6500 [Urine:6500] Intake/Output this shift:    Alert, nad, seems a lot more comfortable this am cta Reg Soft, mild expected TTP. Incisions c/d/i  Lab Results:   Recent Labs  08/29/13 1228 08/30/13 0817  WBC  --  13.4*  HGB 11.9* 11.5*  HCT 35.6* 34.9*  PLT  --  318   BMET No results found for this basename: NA, K, CL, CO2, GLUCOSE, BUN, CREATININE, CALCIUM,  in the last 72 hours PT/INR No results found for this basename: LABPROT, INR,  in the last 72 hours ABG No results found for this basename: PHART, PCO2, PO2, HCO3,  in the last 72 hours  Studies/Results: No results found.  Anti-infectives: Anti-infectives   Start     Dose/Rate Route Frequency Ordered Stop   08/29/13 0537  cefOXitin (MEFOXIN) 2 g in dextrose 5 % 50 mL IVPB     2 g 100 mL/hr over 30 Minutes Intravenous On call to O.R. 08/29/13 0537 08/29/13 0727      Assessment/Plan: s/p Procedure(s): LAPAROSCOPIC ROUX-EN-Y GASTRIC BYPASS WITH UPPER ENDOSCOPY (N/A)  For UGI this am.  No fever, no tachycardia   If UGI ok - will start POD 1 diet and home med Ambulate, pulm toilet Cont VTE prophylaxis  Mary SellaEric M. Andrey CampanileWilson, MD, FACS General, Bariatric, & Minimally Invasive Surgery [facility], [place]   LOS: 1 day    Atilano InaWILSON,Shannia Jacuinde M 08/30/2013

## 2013-08-31 LAB — CBC WITH DIFFERENTIAL/PLATELET
Basophils Absolute: 0 10*3/uL (ref 0.0–0.1)
Basophils Relative: 0 % (ref 0–1)
EOS ABS: 0 10*3/uL (ref 0.0–0.7)
EOS PCT: 0 % (ref 0–5)
HCT: 35.2 % — ABNORMAL LOW (ref 36.0–46.0)
HEMOGLOBIN: 11.2 g/dL — AB (ref 12.0–15.0)
LYMPHS ABS: 2.6 10*3/uL (ref 0.7–4.0)
Lymphocytes Relative: 22 % (ref 12–46)
MCH: 30.1 pg (ref 26.0–34.0)
MCHC: 31.8 g/dL (ref 30.0–36.0)
MCV: 94.6 fL (ref 78.0–100.0)
Monocytes Absolute: 0.8 10*3/uL (ref 0.1–1.0)
Monocytes Relative: 7 % (ref 3–12)
Neutro Abs: 8.2 10*3/uL — ABNORMAL HIGH (ref 1.7–7.7)
Neutrophils Relative %: 71 % (ref 43–77)
Platelets: 301 10*3/uL (ref 150–400)
RBC: 3.72 MIL/uL — ABNORMAL LOW (ref 3.87–5.11)
RDW: 13.3 % (ref 11.5–15.5)
WBC: 11.5 10*3/uL — ABNORMAL HIGH (ref 4.0–10.5)

## 2013-08-31 MED ORDER — BISACODYL 10 MG RE SUPP
10.0000 mg | Freq: Once | RECTAL | Status: DC
  Filled 2013-08-31: qty 1

## 2013-08-31 NOTE — Progress Notes (Signed)
Spoke with patient in regards to readiness for nighttime CPAP, appropriate equipment at bedside--patient prefers to self administer when ready. RT will assist as needed.

## 2013-08-31 NOTE — Progress Notes (Signed)
Brief Pharmacy note: patient on Methadone solution, dose ordered 110mg  daily, home dose 220mg  daily. One dose of 220mg  from patient home supply was delivered to Pharmacy on 1/14. When order verified, not indicated as home med, therefore RN removed 110mg  dose of Methadone liquid from Pyxis, patient did not tolerate (unflavored solution).      1/15 am: MD called, pharmacy unit dosed the liquid into two doses of 11ml, delivered one to floor to RN for 1/15 am dose. We have one more dose of 110mg  for Friday 1/16 am.  Thank you Otho BellowsGreen, Antoria Lanza L PharmD 10:14 AM

## 2013-08-31 NOTE — Progress Notes (Signed)
2 Days Post-Op  Subjective: Having issues with oral methadone. Tolerating water but feels very full constantly. No regurg/nausea  Objective: Vital signs in last 24 hours: Temp:  [98.4 F (36.9 C)-98.6 F (37 C)] 98.5 F (36.9 C) (01/15 0605) Pulse Rate:  [72-81] 72 (01/15 0605) Resp:  [16-18] 17 (01/15 0605) BP: (109-133)/(66-85) 132/85 mmHg (01/15 0605) SpO2:  [91 %-97 %] 94 % (01/15 0605) Last BM Date: 08/28/13  Intake/Output from previous day: 01/14 0701 - 01/15 0700 In: 3080.4 [P.O.:120; I.V.:2960.4] Out: 2800 [Urine:2800] Intake/Output this shift:    Pt walking in halls nad Soft, incisions ok. Expected mild TTP  Lab Results:   Recent Labs  08/30/13 0817 08/30/13 1605 08/31/13 0552  WBC 13.4*  --  11.5*  HGB 11.5* 11.1* 11.2*  HCT 34.9* 33.2* 35.2*  PLT 318  --  301   BMET  Recent Labs  08/30/13 0817  NA 137  K 4.9  CL 102  CO2 26  GLUCOSE 142*  BUN 8  CREATININE 0.83  CALCIUM 9.2   PT/INR No results found for this basename: LABPROT, INR,  in the last 72 hours ABG No results found for this basename: PHART, PCO2, PO2, HCO3,  in the last 72 hours  Studies/Results: Dg Ugi W/water Sol Cm  08/30/2013   CLINICAL DATA:  Status post gastric bypass procedure.  EXAM: WATER SOLUBLE UPPER GI SERIES  TECHNIQUE: Single-column upper GI series was performed using water soluble contrast.  CONTRAST:  40mL OMNIPAQUE IOHEXOL 300 MG/ML  SOLN  COMPARISON:  06/16/2013  FLUOROSCOPY TIME:  3 min and 9 seconds  FINDINGS: Preprocedural KUB demonstrated surgical clips in the right upper quadrant of the abdomen related to prior cholecystectomy. Postoperative changes of spinal fixation are also noted in the lower lumbar spine. Bowel gas pattern is nonobstructive.  Fluoroscopic images demonstrate a small gastric pouch. After nearly 10 min of observation, a small amount of contrast was noted to exit the gastric pouch into the proximal small bowel. No extravasation of contrast material  is noted. The anastomosis with the small bowel of appeared slightly narrowed.  IMPRESSION: 1. Postoperative changes of gastric bypass with narrowing of the anastomosis with the small bowel, presumably related to some mild postoperative edema. No evidence of obstruction. No extravasation of contrast material.   Electronically Signed   By: Trudie Reedaniel  Entrikin M.D.   On: 08/30/2013 09:43    Anti-infectives: Anti-infectives   Start     Dose/Rate Route Frequency Ordered Stop   08/29/13 0537  cefOXitin (MEFOXIN) 2 g in dextrose 5 % 50 mL IVPB     2 g 100 mL/hr over 30 Minutes Intravenous On call to O.R. 08/29/13 0537 08/29/13 0727      Assessment/Plan: s/p Procedure(s): LAPAROSCOPIC ROUX-EN-Y GASTRIC BYPASS WITH UPPER ENDOSCOPY (N/A)  No fever, tachycardia Cont water and will let pt have POD 2 diet but given edema at anastomosis on UGI yesterday not surprised she is feeling full and discussed this pt. Therefore will not discharge today. Explained that it may take some time for swelling to go down.   Cont VTE prophylaxis Chronic pain - Will discuss with pharmacy pt's methadone issue; discussed dosage with her treatment center. Recommend 110mg  daily for now and normal home dose on d/c in addition to prn breakthru oxycodone.   Mary SellaEric M. Andrey CampanileWilson, MD, FACS General, Bariatric, & Minimally Invasive Surgery [facility], [place]   LOS: 2 days    Atilano InaWILSON,Adasyn Mcadams M 08/31/2013

## 2013-09-01 DIAGNOSIS — E039 Hypothyroidism, unspecified: Secondary | ICD-10-CM | POA: Diagnosis present

## 2013-09-01 DIAGNOSIS — I10 Essential (primary) hypertension: Secondary | ICD-10-CM | POA: Diagnosis present

## 2013-09-01 DIAGNOSIS — Z9884 Bariatric surgery status: Secondary | ICD-10-CM

## 2013-09-01 DIAGNOSIS — G4733 Obstructive sleep apnea (adult) (pediatric): Secondary | ICD-10-CM | POA: Diagnosis present

## 2013-09-01 DIAGNOSIS — Z9989 Dependence on other enabling machines and devices: Secondary | ICD-10-CM

## 2013-09-01 DIAGNOSIS — K2270 Barrett's esophagus without dysplasia: Secondary | ICD-10-CM | POA: Diagnosis present

## 2013-09-01 DIAGNOSIS — K219 Gastro-esophageal reflux disease without esophagitis: Secondary | ICD-10-CM | POA: Diagnosis present

## 2013-09-01 MED ORDER — OXYCODONE HCL 5 MG/5ML PO SOLN: 5.0000 mg | ORAL | Status: DC | PRN

## 2013-09-01 MED ORDER — METHADONE HCL 10 MG/ML PO CONC: 110.0000 mg | Freq: Once | ORAL | Status: DC

## 2013-09-01 MED ORDER — METHADONE HCL 10 MG/ML PO CONC: 220.0000 mg | Freq: Every day | ORAL | Status: DC

## 2013-09-01 NOTE — Discharge Summary (Signed)
Physician Discharge Summary  Sharon Hughes WUJ:[id number] DOB: [birth year] DOA: 08/29/2013  PCP: Sharon Real, MD  Admit date: 08/29/2013 Discharge date: 09/01/2013  Recommendations for Outpatient Follow-up:   Follow-up Information   Follow up with Sharon Ina, MD On 09/14/2013. (11:30)    Specialty:  General Surgery   Contact information:   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 Please see operative note. Preoperatively the patient was given 5000 units of subcutaneous heparin for DVT prophylaxis. Postoperative prophylactic Lovenox dosing was started on the morning of postoperative day 1. The patient underwent an upper GI on postoperative day 1 which demonstrated no extravasation of contrast and emptying of the contrast into the Roux limb; however, there was suggestion of edema at the anastomosis given the delay in passage of contrast into the roux limb. The patient was started on ice chips and water which they tolerated. Pain control was a little challenging given her chronic pain and being on methadone. She was restarted on half of her home dose on POD 1. She was also started on oxycodone prn as well. She also receieved IV tylenol and prn  morphine and dilaudid. On postoperative day 2 The patient's diet was advanced to protein shakes however she did not take much. Therefore I decided to keep her an additional day. On POD 3, her oral intake was acceptable and she was tolerating the protein shakes.. The patient was ambulating without difficulty. Their vital signs are stable without fever or tachycardia. Their hemoglobin had remained stable. The patient was maintained on their home settings for CPAP therapy. The patient had received discharge instructions and counseling. They were deemed stable for discharge. I had discussed her discharge pain medications with Sharon Hughes.   BP 123/72  Pulse 70  Temp(Src) 98.3 F (36.8 C) (Oral)  Resp 18  Ht 5\' 4"  (1.626 Hughes)  Wt 219 lb 8 oz (99.565 kg)  BMI 37.66 kg/m2  SpO2 99%  Gen: alert, NAD, non-toxic appearing Pupils: equal, no scleral icterus Pulm: Lungs clear to auscultation, symmetric chest rise CV: regular rate and rhythm Abd: soft, expected mild enderness, nondistended.  No cellulitis. No incisional hernia Ext: no edema, no calf tenderness Skin: no rash, no jaundice  Discharge Instructions  Discharge Orders   Future Appointments Provider Department Dept Phone   09/12/2013 3:30 PM [facility] Post-Op Class [facility] [phone]   09/14/2013 11:30 AM Sharon Ina, MD [facility], [place] [phone]   Future Orders Complete By Expires   Discharge patient  As directed    Comments:     To home       Medication List         albuterol 108 (90 BASE) MCG/ACT inhaler  Commonly known as:  PROVENTIL HFA;VENTOLIN HFA  Inhale 2 puffs into the lungs every 4 (four) hours as needed for wheezing or shortness of breath.     cimetidine 400 MG tablet  Commonly known as:  TAGAMET  Take 400 mg by mouth every morning.     gabapentin 600 MG tablet  Commonly known as:  NEURONTIN  Take 600 mg by mouth 4 (four) times daily as  needed (pain).     lactulose 10 GM/15ML solution  Commonly known as:  CHRONULAC  Take 30 mLs by mouth 2 (two) times daily.     levothyroxine 300 MCG tablet  Commonly known as:  SYNTHROID, LEVOTHROID  Take 300 mcg by mouth daily before breakfast.     lisinopril-hydrochlorothiazide 20-12.5 MG per tablet  Commonly known as:  PRINZIDE,ZESTORETIC  Take 1 tablet by mouth daily with breakfast.     methadone 10 MG/ML solution  Commonly known as:  DOLOPHINE  Take 220 mg by mouth daily with breakfast.     oxyCODONE 5 MG/5ML solution  Commonly known as:  ROXICODONE  Take 5-10 mLs (5-10 mg total) by mouth every 4 (four) hours as needed for severe pain.           Follow-up Information   Follow up with Sharon Ganus M, MD On 09/14/2013. (11:30)    Specialty:  General Surgery   CoAtilano Hughes information:   [address] [place] [address] 225 [phone]        The results of significant diagnostics from this hospitalization (including imaging, microbiology, ancillary and laboratory) are listed below for reference.    Significant Diagnostic Studies: Dg Ugi W/water Sol Cm  08/30/2013   CLINICAL DATA:  Status post gastric bypass procedure.  EXAM: WATER SOLUBLE UPPER GI SERIES  TECHNIQUE: Single-column upper GI series was performed using water soluble contrast.  CONTRAST:  40mL OMNIPAQUE IOHEXOL 300 MG/ML  SOLN  COMPARISON:  06/16/2013  FLUOROSCOPY TIME:  3 min and 9 seconds  FINDINGS: Preprocedural KUB demonstrated surgical clips in the right upper quadrant of the abdomen related to prior cholecystectomy. Postoperative changes of spinal fixation are also noted in the lower lumbar spine. Bowel gas pattern is nonobstructive.  Fluoroscopic images demonstrate a small gastric pouch. After nearly 10 min of observation, a small amount of contrast was noted to exit the gastric pouch into the proximal small bowel. No extravasation of contrast material is noted. The anastomosis with the small bowel  of appeared slightly narrowed.  IMPRESSION: 1. Postoperative changes of gastric bypass with narrowing of the anastomosis with the small bowel, presumably related to some mild postoperative edema. No evidence of obstruction. No extravasation of contrast material.   Electronically Signed   By: Trudie Reedaniel  Entrikin Hughes.D.   On: 08/30/2013 09:43    Labs: Basic Metabolic Panel:  Recent Labs Lab 08/30/13 0817  NA 137  K 4.9  CL 102  CO2 26  GLUCOSE 142*  BUN 8  CREATININE 0.83  CALCIUM 9.2   Liver Function Tests:  Recent Labs Lab 08/30/13 0817  AST 43*  ALT 62*  ALKPHOS 38*  BILITOT 0.2*  PROT 7.1  ALBUMIN 3.7    CBC:  Recent Labs Lab 08/29/13 1228 08/30/13 0817 08/30/13 1605 08/31/13 0552  WBC  --  13.4*  --  11.5*  NEUTROABS  --  10.4*  --  8.2*  HGB 11.9* 11.5* 11.1* 11.2*  HCT 35.6* 34.9* 33.2* 35.2*  MCV  --  92.1  --  94.6  PLT  --  318  --  301    CBG: No results found for this basename: GLUCAP,  in the last 168 hours  Active Problems:   Lumbar stenosis with neurogenic claudication   Obesity (BMI 30-39.9)   OSA on CPAP   GERD (gastroesophageal reflux disease)   HTN (hypertension)   Barrett's esophagus   Unspecified hypothyroidism   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

## 2013-09-01 NOTE — Progress Notes (Signed)
Patient dressed and ready for d/c to home. Husband present during discharge instructions. Rate abd surgical pain at an 8. To start back on her own methadone at home. Rx give for oxycodone solution. Belongings packed. Staff member transport to vehicle via wheelchair. Has been ambulatory and voiding on own. Not desire unjury as ordered re: not want to get nauseated from riding home. Nausea denied at this time.

## 2013-09-01 NOTE — Discharge Instructions (Signed)

## 2013-09-01 NOTE — Progress Notes (Signed)
Patient refused recently ordered dose of methadone. Does not like taste of hospital-supplied methadone. Prefers to take her own methadone when she gets home after discharge pending today. Dilaudid 2mg  given IV as requested for c/o pain. Will drink unjury when ready.

## 2013-09-01 NOTE — Progress Notes (Signed)
Patient alert and oriented, pain is controlled. Patient is tolerating fluids, plan to advance to protein shake today. Reviewed Gastric Bypass discharge instructions with patient and patient is able to articulate understanding. Provided information on BELT program, Support Group and [facility] outpatient pharmacy. All questions answered, will continue to monitor.  

## 2013-09-05 ENCOUNTER — Telehealth (HOSPITAL_COMMUNITY): Payer: Self-pay

## 2013-09-05 NOTE — Telephone Encounter (Signed)
Patient called in with multiple questions, left a message for her call to be returned.  Returned call but no answer, supplied patient with my email address to have her follow up this evening if urgent, but by phone is questions can wait until tomorrow.

## 2013-09-06 ENCOUNTER — Telehealth (INDEPENDENT_AMBULATORY_CARE_PROVIDER_SITE_OTHER): Payer: Self-pay

## 2013-09-06 ENCOUNTER — Other Ambulatory Visit (INDEPENDENT_AMBULATORY_CARE_PROVIDER_SITE_OTHER): Payer: Self-pay

## 2013-09-06 DIAGNOSIS — R1013 Epigastric pain: Secondary | ICD-10-CM

## 2013-09-06 DIAGNOSIS — E860 Dehydration: Secondary | ICD-10-CM

## 2013-09-06 NOTE — Telephone Encounter (Signed)
Pt called stating she has been able to only take in 3-4 oz of fluid/shake in yesterday and today. Pt states she is also nauseated. She states it is not because of nausea that she cannot take it more by mouth. Pt states she is limited due to feeling extremely full and uncomfortable after as small sip or 2. No vomiting. No fever. Did have BM yesterday. Voiding has decreased. Pt states she has not transportation today. I advised her I will review this with Dr Johna SheriffHoxworth one of the bariatric MDs today since Dr Andrey CampanileWilson is off. Pt can be reached at [phone].

## 2013-09-06 NOTE — Telephone Encounter (Signed)
Per Dr Jamse MeadHoxworth's request pt advised to stay on water only for 24hrs and have stat CBC/Bmet with OV tomorrow. Pt will go to [facility] tomorrow at 9:00am for stat labs. She will come to office tomorrow at 1:45 pm to see Dr Andrey CampanileWilson. Pt states she understands and will go for labs and keep appt.

## 2013-09-07 ENCOUNTER — Telehealth (INDEPENDENT_AMBULATORY_CARE_PROVIDER_SITE_OTHER): Payer: Self-pay

## 2013-09-07 ENCOUNTER — Encounter (INDEPENDENT_AMBULATORY_CARE_PROVIDER_SITE_OTHER): Payer: Self-pay | Admitting: General Surgery

## 2013-09-07 ENCOUNTER — Telehealth (INDEPENDENT_AMBULATORY_CARE_PROVIDER_SITE_OTHER): Payer: Self-pay | Admitting: General Surgery

## 2013-09-07 ENCOUNTER — Other Ambulatory Visit (INDEPENDENT_AMBULATORY_CARE_PROVIDER_SITE_OTHER): Payer: Self-pay | Admitting: General Surgery

## 2013-09-07 ENCOUNTER — Ambulatory Visit (INDEPENDENT_AMBULATORY_CARE_PROVIDER_SITE_OTHER): Payer: 59 | Admitting: General Surgery

## 2013-09-07 LAB — CBC WITH DIFFERENTIAL/PLATELET
BASOS PCT: 0 % (ref 0–1)
Basophils Absolute: 0 10*3/uL (ref 0.0–0.1)
EOS PCT: 3 % (ref 0–5)
Eosinophils Absolute: 0.3 10*3/uL (ref 0.0–0.7)
HEMATOCRIT: 40.9 % (ref 36.0–46.0)
Hemoglobin: 13.8 g/dL (ref 12.0–15.0)
Lymphocytes Relative: 27 % (ref 12–46)
Lymphs Abs: 2.5 10*3/uL (ref 0.7–4.0)
MCH: 30.8 pg (ref 26.0–34.0)
MCHC: 33.7 g/dL (ref 30.0–36.0)
MCV: 91.3 fL (ref 78.0–100.0)
MONO ABS: 0.8 10*3/uL (ref 0.1–1.0)
Monocytes Relative: 8 % (ref 3–12)
Neutro Abs: 5.6 10*3/uL (ref 1.7–7.7)
Neutrophils Relative %: 62 % (ref 43–77)
Platelets: 349 10*3/uL (ref 150–400)
RBC: 4.48 MIL/uL (ref 3.87–5.11)
RDW: 13.1 % (ref 11.5–15.5)
WBC: 9.2 10*3/uL (ref 4.0–10.5)

## 2013-09-07 LAB — BASIC METABOLIC PANEL
BUN: 25 mg/dL — ABNORMAL HIGH (ref 6–23)
CALCIUM: 10.4 mg/dL (ref 8.4–10.5)
CO2: 26 mEq/L (ref 19–32)
Chloride: 96 mEq/L (ref 96–112)
Creat: 1.08 mg/dL (ref 0.50–1.10)
GLUCOSE: 112 mg/dL — AB (ref 70–99)
POTASSIUM: 5.7 meq/L — AB (ref 3.5–5.3)
Sodium: 139 mEq/L (ref 135–145)

## 2013-09-07 NOTE — Telephone Encounter (Signed)
Patient just called in. She states that she is actually much improved. She states that now she is tolerating yogurt. She is also tolerating liquids. She doesn't have that immediate full sensation. She denies any upper abdominal pain or left upper quadrant or left back pain. She denies any fevers or chills. Discuss lab results. Advised her that I wanted to have her labs rechecked on Monday. She was advised to return to the same lab location on Monday to have her labs redrawn. Explained that she did not have to be fasting. All questions asked and answered

## 2013-09-07 NOTE — Telephone Encounter (Signed)
Attempted to return patient's phone call as well as discussed lab results. When patient first answered the phone she states that she could not hear anyone on the line.  I called back again and got her voice well and left a message. Explained that I would also send a message through my chart & for her to return our phone call

## 2013-09-07 NOTE — Telephone Encounter (Signed)
Pt called in to speak to Dr Andrey CampanileWilson. I will order Bmet for pt to go on Monday 09/11/13 to get labs drawn [facility] [place] fx [phone]/ ph [phone].

## 2013-09-07 NOTE — Telephone Encounter (Signed)
Lab results given to Dr. Andrey CampanileWilson for review

## 2013-09-12 ENCOUNTER — Encounter (INDEPENDENT_AMBULATORY_CARE_PROVIDER_SITE_OTHER): Payer: Self-pay | Admitting: General Surgery

## 2013-09-12 VITALS — Ht 64.0 in | Wt 212.5 lb

## 2013-09-12 DIAGNOSIS — E669 Obesity, unspecified: Secondary | ICD-10-CM

## 2013-09-12 NOTE — Patient Instructions (Signed)
Goals:  Follow Phase 3A: Soft High Protein Phase  Eat 3-6 small meals/snacks, every 3-5 hrs  Increase lean protein foods to meet 60g goal  Increase fluid intake to 64oz +  Avoid drinking 15 minutes before, during and 30 minutes after eating  Aim for >30 min of physical activity daily per MD 

## 2013-09-12 NOTE — Progress Notes (Signed)
Bariatric Class:  Appt start time: 1530 end time:  1630.  2 Week Post-Operative Nutrition Class  Patient was seen on 09/12/2013 for Post-Operative Nutrition education at [facility].   Surgery date: 08/29/2013 Surgery type: RYGB Starting weight at [facility]: 232 lbs on 06/17/2013  Weight today: 212.5 lbs Weight change: 19.5 lbs Total weight lost: 19.5 lbs  TANITA  BODY COMP RESULTS  09/12/13   BMI (kg/m^2) 36.5   Fat Mass (lbs) 94.5   Fat Free Mass (lbs) 118.0   Total Body Water (lbs) 86.5   The following the learning objectives were met by the patient during this course:  Identifies Phase 3A (Soft, High Proteins) Dietary Goals and will begin from 2 weeks post-operatively to 2 months post-operatively  Identifies appropriate sources of fluids and proteins   States protein recommendations and appropriate sources post-operatively  Identifies the need for appropriate texture modifications, mastication, and bite sizes when consuming solids  Identifies appropriate multivitamin and calcium sources post-operatively  Describes the need for physical activity post-operatively and will follow MD recommendations  States when to call healthcare provider regarding medication questions or post-operative complications  Handouts given during class include:  Phase 3A: Soft, High Protein Diet Handout  Band Fill Guidelines Handout  Follow-Up Plan: Patient will follow-up at [facility] in 6 weeks for 8 week post-op nutrition visit for diet advancement per MD.  

## 2013-09-13 ENCOUNTER — Telehealth (INDEPENDENT_AMBULATORY_CARE_PROVIDER_SITE_OTHER): Payer: Self-pay

## 2013-09-13 ENCOUNTER — Other Ambulatory Visit (INDEPENDENT_AMBULATORY_CARE_PROVIDER_SITE_OTHER): Payer: Self-pay | Admitting: General Surgery

## 2013-09-13 LAB — CBC WITH DIFFERENTIAL/PLATELET
Basophils Absolute: 0 10*3/uL (ref 0.0–0.1)
Basophils Relative: 0 % (ref 0–1)
EOS ABS: 0.3 10*3/uL (ref 0.0–0.7)
Eosinophils Relative: 3 % (ref 0–5)
HCT: 35.9 % — ABNORMAL LOW (ref 36.0–46.0)
HEMOGLOBIN: 11.9 g/dL — AB (ref 12.0–15.0)
LYMPHS ABS: 2.9 10*3/uL (ref 0.7–4.0)
LYMPHS PCT: 38 % (ref 12–46)
MCH: 30.8 pg (ref 26.0–34.0)
MCHC: 33.1 g/dL (ref 30.0–36.0)
MCV: 93 fL (ref 78.0–100.0)
MONOS PCT: 6 % (ref 3–12)
Monocytes Absolute: 0.5 10*3/uL (ref 0.1–1.0)
NEUTROS ABS: 4.2 10*3/uL (ref 1.7–7.7)
NEUTROS PCT: 53 % (ref 43–77)
PLATELETS: 368 10*3/uL (ref 150–400)
RBC: 3.86 MIL/uL — ABNORMAL LOW (ref 3.87–5.11)
RDW: 12.8 % (ref 11.5–15.5)
WBC: 7.9 10*3/uL (ref 4.0–10.5)

## 2013-09-13 LAB — BASIC METABOLIC PANEL
BUN: 15 mg/dL (ref 6–23)
CALCIUM: 9.6 mg/dL (ref 8.4–10.5)
CO2: 29 mEq/L (ref 19–32)
CREATININE: 1.09 mg/dL (ref 0.50–1.10)
Chloride: 99 mEq/L (ref 96–112)
Glucose, Bld: 78 mg/dL (ref 70–99)
Potassium: 4.8 mEq/L (ref 3.5–5.3)
Sodium: 141 mEq/L (ref 135–145)

## 2013-09-13 NOTE — Telephone Encounter (Signed)
Called pt to remind her to get her labs drawn today the BMET that she was supposed to get drawn on Monday the 26th. The pt agrees to go today before her appt tomorrow with Dr Andrey CampanileWilson.

## 2013-09-14 ENCOUNTER — Encounter (INDEPENDENT_AMBULATORY_CARE_PROVIDER_SITE_OTHER): Payer: Self-pay | Admitting: General Surgery

## 2013-09-14 ENCOUNTER — Ambulatory Visit (INDEPENDENT_AMBULATORY_CARE_PROVIDER_SITE_OTHER): Payer: 59 | Admitting: General Surgery

## 2013-09-14 VITALS — BP 124/76 | HR 86 | Temp 98.0°F | Resp 18 | Ht 64.0 in | Wt 209.0 lb

## 2013-09-14 DIAGNOSIS — Z9884 Bariatric surgery status: Secondary | ICD-10-CM

## 2013-09-14 NOTE — Patient Instructions (Signed)
Eating techniques 20-20-20 (30-30-30) 20 chews, 20 seconds between bites of food, 20 minutes to eat; sometimes you may need 30 chews, 30 seconds etc Use your nondominant hand to eat with Put fork down between bites of food Use a timer after swallowing to reinforce waiting 20-30 sec between bites of food Use a child/infant size utensil Try not to eat while watching TV  Start exercising in the pool regularly

## 2013-09-14 NOTE — Progress Notes (Addendum)
Subjective:     Patient ID: Sharon Hughes, female   DOB: 07-12-1966, 48 y.o.   MRN: [id number]  HPI  48 year old Caucasian female comes in for followup after undergoing laparoscopic Roux-en-Y gastric bypass on 08/29/2013. She was discharged on January 16. She was kept in the hospital next today due to anastomotic edema at her gastric jejunal anastomosis.  she's doing well now. She did have a period of some by mouth intolerance about a week out after surgery.At that time we check some basic labs which revealed some mild hyperkalemia as well as a marginally elevated BUN. She had repeat labs done yesterday which revealed normal electrolytes and normal renal function. White blood cell count normal. Hemoglobin 11.9, hematocrit was 35.9. She is no longer having issues with eating and drinking.  She denies any fevers or chills. She denies any significant abdominal pain. She is having bowel movements. She is on chronic methadone for chronic pain. She is no longer taking the Roxicet elixir. She is taking her supplements as directed. She has already seen the nutritionist and been advanced to the next phase of her postoperative diet. This just occurred 2 days ago. She hasn't really tried much. She states that she is tolerating an egg. She denies any left upper abdomen or left back pain. She is having several bowel movements a week which is her baseline. She states that her energy level is back to baseline. She states that she is having trouble with her CPAP machine. She states that the humidifier that was connected to her CPAP while in the hospital worked great and she is interested in obtaining a humidifier for her home CPAP machine. Review of Systems     Objective:   Physical Exam BP 124/76  Pulse 86  Temp(Src) 98 F (36.7 C)  Resp 18  Ht 5\' 4"  (1.626 m)  Wt 209 lb (94.802 kg)  BMI 35.86 kg/m2  Gen: alert, NAD, non-toxic appearing Pupils: equal, no scleral icterus Pulm: Lungs clear to auscultation,  symmetric chest rise CV: regular rate and rhythm Abd: soft, nontender, nondistended. Well-healed trocar sites. No cellulitis. 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 There is no signs of complications. Her vital signs are stable. Her initial visit weight is 231 pounds. Her preoperative weight was 227 pounds. I congratulated her on her weight loss.  We discussed proper eating techniques and behavior such as taking a small bite, chewing very thoroughly, and waiting 20-30 seconds between bites of food. We discussed strategies to adopt these behaviors.  With respect to her CPAP I encouraged her to followup with her primary care physician's office regarding getting approval for a humidifier.  Because of her history of Barrett's esophagus I have recommended that she stay on chronic anti-reflex medications until her next screening endoscopy.  She was instructed to continue with her supplements as directed.  We discussed the importance of routine regular exercise. She is currently looking for a gym with a pool. We [place] We discussed the importance of regular activity in order to maximize and maintain her weight loss. Followup 3-4 weeks.  Mary SellaEric M. Andrey CampanileWilson, MD, FACS General, Bariatric, & Minimally Invasive  Surgery [facility], [place]

## 2013-09-20 ENCOUNTER — Telehealth (INDEPENDENT_AMBULATORY_CARE_PROVIDER_SITE_OTHER): Payer: Self-pay | Admitting: General Surgery

## 2013-09-20 NOTE — Telephone Encounter (Signed)
Pt called to report she vomited today after eating yogurt.  She denies fever, on-going nausea or reflux.  She reports pain in the area of her stomach and going up under her ribs.  Pt noted to be slightly SOB while talking on the telephone.  Tried some slow, deep breathing exercise with her to regulate her breathing and discussed with Dr. Andrey CampanileWilson.  He recommended she take some Maalox or Mylanta and remember to take small bites, chew thoroughly and wait before next bite.  Pt will call back with update on status in about an hour.

## 2013-10-04 ENCOUNTER — Telehealth (INDEPENDENT_AMBULATORY_CARE_PROVIDER_SITE_OTHER): Payer: Self-pay | Admitting: General Surgery

## 2013-10-04 ENCOUNTER — Other Ambulatory Visit (INDEPENDENT_AMBULATORY_CARE_PROVIDER_SITE_OTHER): Payer: Self-pay | Admitting: General Surgery

## 2013-10-04 DIAGNOSIS — R109 Unspecified abdominal pain: Secondary | ICD-10-CM

## 2013-10-04 MED ORDER — ONDANSETRON HCL 8 MG PO TABS: 8.0000 mg | ORAL_TABLET | Freq: Three times a day (TID) | ORAL | Status: DC | PRN

## 2013-10-04 NOTE — Addendum Note (Signed)
Addended by: Andrey CampanileWILSON, Bridget Westbrooks M on: 10/04/2013 10:06 AM   Modules accepted: Orders

## 2013-10-04 NOTE — Telephone Encounter (Signed)
Pt called for advice on her ongoing nausea and vomiting.  She is quick to explained that she "is following all the instructions to eat small bites, chew thoroughly and wait between bites."  She admits to vomiting at least once daily and taking Dramamine OTC for nausea.  Her next appt is in March.  Needs something to control the nausea, please.

## 2013-10-04 NOTE — Telephone Encounter (Signed)
Called patient and she is schedule for [facility] [address] at 12:45 arrival time for a 1:00 apt. Patient is aware that Dr Andrey CampanileWilson will call patient with the test results and she agree to that and if we need to get her in to see Dr Andrey CampanileWilson that I will call her for an apt

## 2013-10-04 NOTE — Telephone Encounter (Signed)
Sent in Rx for zofran. Set up pt for UGI.

## 2013-10-05 ENCOUNTER — Encounter (INDEPENDENT_AMBULATORY_CARE_PROVIDER_SITE_OTHER): Payer: Self-pay

## 2013-10-05 ENCOUNTER — Ambulatory Visit
Admission: RE | Admit: 2013-10-05 | Discharge: 2013-10-05 | Disposition: A | Discharge status: Home / Self Care | Payer: 59 | Source: Ambulatory Visit | Attending: General Surgery | Admitting: General Surgery

## 2013-10-05 DIAGNOSIS — R109 Unspecified abdominal pain: Secondary | ICD-10-CM

## 2013-10-19 ENCOUNTER — Ambulatory Visit (INDEPENDENT_AMBULATORY_CARE_PROVIDER_SITE_OTHER): Payer: 59 | Admitting: General Surgery

## 2013-10-19 ENCOUNTER — Telehealth (INDEPENDENT_AMBULATORY_CARE_PROVIDER_SITE_OTHER): Payer: Self-pay | Admitting: General Surgery

## 2013-10-19 NOTE — Telephone Encounter (Signed)
LMOM for Carollee HerterShannon to call back and ask for Pattricia Bossnnie, I need to make her an apt with Dr Andrey CampanileWilson

## 2013-10-24 ENCOUNTER — Ambulatory Visit: Payer: 59 | Admitting: Dietician

## 2013-10-26 ENCOUNTER — Telehealth (INDEPENDENT_AMBULATORY_CARE_PROVIDER_SITE_OTHER): Payer: Self-pay | Admitting: General Surgery

## 2013-10-26 ENCOUNTER — Ambulatory Visit (INDEPENDENT_AMBULATORY_CARE_PROVIDER_SITE_OTHER): Payer: 59 | Admitting: General Surgery

## 2013-10-26 ENCOUNTER — Encounter (INDEPENDENT_AMBULATORY_CARE_PROVIDER_SITE_OTHER): Payer: Self-pay | Admitting: General Surgery

## 2013-10-26 NOTE — Telephone Encounter (Signed)
Patient called back and I moved her to Dr Andrey CampanileWilson clinic on 11-02-13 and called into [facility] a Rx for carafate 1 gm/6610ml suspension oral qty 420 with no refills

## 2013-10-30 ENCOUNTER — Ambulatory Visit: Payer: 59 | Admitting: Dietician

## 2013-11-02 ENCOUNTER — Encounter (INDEPENDENT_AMBULATORY_CARE_PROVIDER_SITE_OTHER): Payer: Self-pay | Admitting: General Surgery

## 2013-11-02 ENCOUNTER — Ambulatory Visit (INDEPENDENT_AMBULATORY_CARE_PROVIDER_SITE_OTHER): Payer: 59 | Admitting: General Surgery

## 2013-11-02 VITALS — BP 126/78 | HR 86 | Temp 97.3°F | Resp 16 | Ht 64.0 in | Wt 197.2 lb

## 2013-11-02 DIAGNOSIS — Z9884 Bariatric surgery status: Secondary | ICD-10-CM

## 2013-11-02 MED ORDER — SUCRALFATE 1 GM/10ML PO SUSP: 1.0000 g | Freq: Three times a day (TID) | ORAL | Status: DC

## 2013-11-02 NOTE — Progress Notes (Signed)
Subjective:     Patient ID: Sharon Hughes, female   DOB: [birth year], 48 y.o.   MRN: [id number]  HPI 48 year old Caucasian female comes in for followup after undergoing laparoscopic Roux-en-Y gastric bypass on 08/29/2013. I last saw her in the office on January 29. She has had some transportation issues and we've had to reschedule several appointments. Prior to today's visit she had been having some intolerance to solid foods. She described vomiting and nausea with solids. She denied any difficulty with medications and liquids. We obtained an upper GI which was normal. However the anastomosis may have been a little bit narrowed on the upper GI. However liquid quickly emptied out. She states that since we started having her to take the Carafate solids have gone down much easier. She is only reporting one episode of regurgitation. She denies any abdominal pain. She denies any nausea. She is taking her reflux medication. She reports that she is taking her supplements. She is not hungry during the daytime. She gets full quickly. She is not exercising currently. She is drinking about 32 ounces of water a day. If she does not take Dulcolax she will go 6-7 days without having a bowel movement.  Review of Systems     Objective:   Physical Exam BP 126/78  Pulse 86  Temp(Src) 97.3 F (36.3 C) (Temporal)  Resp 16  Ht 5\' 4"  (1.626 m)  Wt 197 lb 3.2 oz (89.449 kg)  BMI 33.83 kg/m2  Gen: alert, NAD, non-toxic appearing Pupils: equal, no scleral icterus Pulm: Lungs clear to auscultation, symmetric chest rise CV: regular rate and rhythm Abd: soft, nontender, nondistended. Well-healed trocar sites. No cellulitis. 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 Is unclear she had an ulcer or just some tightness around her anastomosis. Nonetheless I told her wanted her to continue the Carafate for another 2-3 weeks. If she has recurrent symptoms after we stop the Carafate and then she will need an upper endoscopy. With respect to her infrequent stools she is on chronic methadone and takes lactulose. I do believe she is not getting enough liquid intake which may be contributing to her constipation. Her monitor that she should strive to get 64 ounces of liquid in the day. I also advised her to take MiraLAX as needed. She was instructed to continue her reflux medications that she has known Barrett's esophagus. We discussed the importance of regular physical activity to help with her weight loss and to help with the weight gain prevention. She is interested in joining the belt program but can only do the on line eversion. We gave her the paperwork to enroll in the belt program today.  I'd like to keep a close eye on her so going to bring her back in about 6 weeks.  her initial visit weight is 231 pounds. Her preoperative weight was 227.   Sharon SellaEric M. Andrey CampanileWilson, MD, FACS General, Bariatric, & Minimally Invasive Surgery [facility], [place]

## 2013-11-02 NOTE — Patient Instructions (Signed)
Follow up with BELT program Continue to take carafate for another 2-3 weeks. Please let me know if you have recurrent issues with regurgitation/vomiting. If so, we will need to do an upper endoscopy Take Miralax as needed for constipation Increase fluid intake - aim for 64 ounces per day

## 2013-11-16 ENCOUNTER — Ambulatory Visit (INDEPENDENT_AMBULATORY_CARE_PROVIDER_SITE_OTHER): Payer: 59 | Admitting: General Surgery

## 2013-11-22 ENCOUNTER — Encounter (INDEPENDENT_AMBULATORY_CARE_PROVIDER_SITE_OTHER): Payer: Self-pay | Admitting: General Surgery

## 2013-11-23 ENCOUNTER — Other Ambulatory Visit (INDEPENDENT_AMBULATORY_CARE_PROVIDER_SITE_OTHER): Payer: Self-pay | Admitting: General Surgery

## 2013-11-27 ENCOUNTER — Encounter (HOSPITAL_COMMUNITY): Payer: Self-pay | Admitting: *Deleted

## 2013-11-27 DIAGNOSIS — K910 Vomiting following gastrointestinal surgery: Secondary | ICD-10-CM

## 2013-11-27 HISTORY — DX: Vomiting following gastrointestinal surgery: K91.0

## 2013-11-29 ENCOUNTER — Encounter (HOSPITAL_COMMUNITY): Payer: Self-pay | Admitting: Pharmacy Technician

## 2013-12-01 ENCOUNTER — Encounter (HOSPITAL_COMMUNITY): Payer: 59 | Admitting: Registered Nurse

## 2013-12-01 ENCOUNTER — Encounter (HOSPITAL_COMMUNITY): Payer: Self-pay | Admitting: *Deleted

## 2013-12-01 ENCOUNTER — Ambulatory Visit (HOSPITAL_COMMUNITY)
Admission: RE | Admit: 2013-12-01 | Discharge: 2013-12-01 | Disposition: A | Discharge status: Home / Self Care | Payer: 59 | Source: Ambulatory Visit | Attending: General Surgery | Admitting: General Surgery

## 2013-12-01 ENCOUNTER — Encounter (HOSPITAL_COMMUNITY)
Admission: RE | Disposition: A | Discharge status: Home / Self Care | Payer: Self-pay | Source: Ambulatory Visit | Attending: General Surgery

## 2013-12-01 ENCOUNTER — Ambulatory Visit (HOSPITAL_COMMUNITY): Payer: 59 | Admitting: Registered Nurse

## 2013-12-01 DIAGNOSIS — J45909 Unspecified asthma, uncomplicated: Secondary | ICD-10-CM | POA: Insufficient documentation

## 2013-12-01 DIAGNOSIS — Z87891 Personal history of nicotine dependence: Secondary | ICD-10-CM | POA: Insufficient documentation

## 2013-12-01 DIAGNOSIS — I10 Essential (primary) hypertension: Secondary | ICD-10-CM | POA: Insufficient documentation

## 2013-12-01 DIAGNOSIS — Z9089 Acquired absence of other organs: Secondary | ICD-10-CM | POA: Insufficient documentation

## 2013-12-01 DIAGNOSIS — E039 Hypothyroidism, unspecified: Secondary | ICD-10-CM | POA: Insufficient documentation

## 2013-12-01 DIAGNOSIS — Z981 Arthrodesis status: Secondary | ICD-10-CM | POA: Insufficient documentation

## 2013-12-01 DIAGNOSIS — R109 Unspecified abdominal pain: Secondary | ICD-10-CM

## 2013-12-01 DIAGNOSIS — R1115 Cyclical vomiting syndrome unrelated to migraine: Secondary | ICD-10-CM | POA: Insufficient documentation

## 2013-12-01 DIAGNOSIS — Z79899 Other long term (current) drug therapy: Secondary | ICD-10-CM | POA: Insufficient documentation

## 2013-12-01 DIAGNOSIS — Z9884 Bariatric surgery status: Secondary | ICD-10-CM | POA: Insufficient documentation

## 2013-12-01 DIAGNOSIS — R1110 Vomiting, unspecified: Secondary | ICD-10-CM

## 2013-12-01 DIAGNOSIS — G4730 Sleep apnea, unspecified: Secondary | ICD-10-CM | POA: Insufficient documentation

## 2013-12-01 HISTORY — PX: ESOPHAGOGASTRODUODENOSCOPY: SHX5428

## 2013-12-01 HISTORY — DX: Vomiting following gastrointestinal surgery: K91.0

## 2013-12-01 SURGERY — EGD (ESOPHAGOGASTRODUODENOSCOPY)
Anesthesia: Monitor Anesthesia Care

## 2013-12-01 MED ORDER — LIDOCAINE HCL (CARDIAC) 20 MG/ML IV SOLN
INTRAVENOUS | Status: AC
  Filled 2013-12-01: qty 5

## 2013-12-01 MED ORDER — MIDAZOLAM HCL 2 MG/2ML IJ SOLN
INTRAMUSCULAR | Status: AC
  Filled 2013-12-01: qty 2

## 2013-12-01 MED ORDER — KETAMINE HCL 50 MG/ML IJ SOLN
INTRAMUSCULAR | Status: DC | PRN
  Administered 2013-12-01 (×3): 10 mg via INTRAMUSCULAR

## 2013-12-01 MED ORDER — LACTATED RINGERS IV SOLN
INTRAVENOUS | Status: DC
  Administered 2013-12-01: 1000 mL via INTRAVENOUS

## 2013-12-01 MED ORDER — PROPOFOL 10 MG/ML IV BOLUS
INTRAVENOUS | Status: AC
  Filled 2013-12-01: qty 20

## 2013-12-01 MED ORDER — SODIUM CHLORIDE 0.9 % IJ SOLN
INTRAMUSCULAR | Status: AC
  Filled 2013-12-01: qty 10

## 2013-12-01 MED ORDER — PROPOFOL 10 MG/ML IV BOLUS
INTRAVENOUS | Status: DC | PRN
  Administered 2013-12-01 (×7): 50 mg via INTRAVENOUS

## 2013-12-01 MED ORDER — BUTAMBEN-TETRACAINE-BENZOCAINE 2-2-14 % EX AERO
INHALATION_SPRAY | CUTANEOUS | Status: DC | PRN
  Administered 2013-12-01: 2 via TOPICAL

## 2013-12-01 MED ORDER — EPHEDRINE SULFATE 50 MG/ML IJ SOLN
INTRAMUSCULAR | Status: AC
  Filled 2013-12-01: qty 1

## 2013-12-01 NOTE — H&P (Signed)
Sharon Hughes is an 48 y.o. female.   Chief Complaint: pain and vomiting after eating HPI: 48 yo WF s/p lap roux en y gastric bypass 08/2013 who has had persistent pain with solid intake. She reports that shell develop b/l upper abd pain under ribs after eating solids. She will ultimately vomit what she has eaten up to 12 hrs later. The pain is immediately relieved. She has no pain iwht liquids. No f/c. No hematemesis. Normal BMs. We obtained an UGI in Feb b/c of these symptoms which was normal. She is here today for endo for ongoing symptoms  Past Medical History  Diagnosis Date  . Hypertension     takes Lisinopril prn  . Pneumonia     hx of--2009  . Bronchitis 2011  . Seasonal allergies   . Chronic back pain     degenerative disc and stenosis  . Arthritis   . Barrett's esophagus   . Ulcer     unsure if gastric/peptic/duodenal but cleared up with carafate  . 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 Hemorrhoids     banded  . UTI (lower urinary tract infection)     finished up antibiotics last week   . Hypothyroidism     takes Levothyroxine daily  . Gastric ulcer   . Degenerative disc disease   . Asthma      Albuterol prn  . Headache(784.0)     none recent  . Sleep apnea     no cpap used since 1'15  . Vomiting following gastrointestinal surgery 11-27-13    persistent vomiting following gastric bypass surgery 1'15. persistent weight lost 50+ and continues to lose at 179 # today.    Past Surgical History  Procedure Laterality Date  . Lumbar laminectomy  06/14/2009  . Cervical fusion  2002  . Cesarean section      x 3  . Tubal reversed  2006  . Esophagogastroduodenoscopy    . Colonoscopy    . Anterior lat lumbar fusion  01/04/2012    Procedure: ANTERIOR LATERAL LUMBAR FUSION 1 LEVEL;  Surgeon: Temple PaciniHenry A Pool, MD;  Location: [facility];  Service: Neurosurgery;  Laterality: Left;  ANTERIOR LATERAL LUMBAR THREE-FOUR EXTREME LUMBAR INTERBODY  FUSION/Lateral plating  . Cholecystectomy  2009  . Tubal ligation  2001    with one of the c/s  . Gastric roux-en-y N/A 08/29/2013    Procedure: LAPAROSCOPIC ROUX-EN-Y GASTRIC BYPASS WITH UPPER ENDOSCOPY;  Surgeon: Atilano InaEric M Keysi Oelkers, MD;  Location: [facility] ORS;  Service: General;  Laterality: N/A;    Family History  Problem Relation Age of Onset  . Anesthesia problems Neg Hx   . Hypotension Neg Hx   . Malignant hyperthermia Neg Hx   . Pseudochol deficiency Neg Hx   . Stroke Maternal Grandmother    Social History:  reports that she quit smoking about 14 months ago. Her smoking use included Cigarettes. She has a .75 pack-year smoking history. She has never used smokeless tobacco. She reports that she does not drink alcohol or use illicit drugs.  Allergies:  Allergies  Allergen Reactions  . Ativan [Lorazepam] Other (See Comments)    Aggravates restless leg syndrome  . Other Other (See Comments)    Melons cause bumps and swelling of the throat  . Toradol [Ketorolac Tromethamine] Other (See Comments)    Aggravates restless leg syndrome  . Vistaril [Hydroxyzine Hcl] Other (See Comments)    Aggravates restless leg syndrome  Medications Prior to Admission  Medication Sig Dispense Refill  . albuterol (PROVENTIL HFA;VENTOLIN HFA) 108 (90 BASE) MCG/ACT inhaler Inhale 2 puffs into the lungs every 4 (four) hours as needed for wheezing or shortness of breath.       . Calcium-Magnesium-Vitamin D (CALCIUM 1200+D3 PO) Take 1 tablet by mouth daily.      . cimetidine (TAGAMET) 400 MG tablet Take 800 mg by mouth every morning.       . gabapentin (NEURONTIN) 600 MG tablet Take 600 mg by mouth 4 (four) times daily as needed (pain).       Marland Kitchen lactulose (CHRONULAC) 10 GM/15ML solution Take 30 mLs by mouth 2 (two) times daily as needed for mild constipation.       Marland Kitchen levothyroxine (SYNTHROID, LEVOTHROID) 300 MCG tablet Take 300 mcg by mouth daily before breakfast.      . methadone (DOLOPHINE) 10 MG/ML solution  Take 220 mg by mouth daily with breakfast.       . Multiple Vitamin (MULTIVITAMIN WITH MINERALS) TABS tablet Take 1 tablet by mouth daily.      . B Complex-C (B-COMPLEX WITH VITAMIN C) tablet Take 1 tablet by mouth daily.        No results found for this or any previous visit (from the past 48 hour(s)). No results found.  Review of Systems  Constitutional: Positive for weight loss. Negative for fever and chills.  HENT: Negative for nosebleeds.   Eyes: Negative for blurred vision.  Respiratory: Negative for shortness of breath.   Cardiovascular: Negative for chest pain, palpitations, orthopnea and PND.       Denies DOE  Gastrointestinal: Positive for vomiting and abdominal pain. Negative for diarrhea and constipation.  Genitourinary: Negative for dysuria and hematuria.  Musculoskeletal: Negative.   Skin: Negative for itching and rash.  Neurological: Negative for dizziness, focal weakness, seizures, loss of consciousness and headaches.       Denies TIAs, amaurosis fugax  Endo/Heme/Allergies: Does not bruise/bleed easily.  Psychiatric/Behavioral: The patient is not nervous/anxious.     Blood pressure 120/68, temperature 98.8 F (37.1 C), temperature source Oral, resp. rate 18, height 5' 4.5" (1.638 m), weight 176 lb (79.833 kg), SpO2 98.00%. Physical Exam  Vitals reviewed. Constitutional: She is oriented to person, place, and time. She appears well-developed and well-nourished. No distress.  HENT:  Head: Normocephalic and atraumatic.  Right Ear: External ear normal.  Left Ear: External ear normal.  Eyes: Conjunctivae are normal. No scleral icterus.  Neck: Normal range of motion. Neck supple. No tracheal deviation present. No thyromegaly present.  Cardiovascular: Normal rate and normal heart sounds.   Respiratory: Effort normal and breath sounds normal. No stridor. No respiratory distress. She has no wheezes.  GI: Soft. She exhibits no distension. There is no tenderness. There is no  rebound and no guarding.  Well healed trocar sites  Musculoskeletal: She exhibits no edema and no tenderness.  Lymphadenopathy:    She has no cervical adenopathy.  Neurological: She is alert and oriented to person, place, and time. She exhibits normal muscle tone.  Skin: Skin is warm and dry. No rash noted. She is not diaphoretic. No erythema. No pallor.  Psychiatric: She has a normal mood and affect. Her behavior is normal. Judgment and thought content normal.     Assessment/Plan Postprandial abd pain/vomiting S/p LRYGB  To Endo for EGD.  Discuss risk/benefits of endo - bleeding, infection, injury to surrounding structures, need for addl procedures, perforatoin, anesthesia risks. Pt wishes to  proceed.  Sharon SellaEric M. Andrey CampanileWilson, MD, FACS General, Bariatric, & Minimally Invasive Surgery [facility] [place]   Atilano Inaric M Aristea Posada 12/01/2013, 7:33 AM

## 2013-12-01 NOTE — Op Note (Signed)
[facility] [address] [place], [id number]   OPERATIVE PROCEDURE REPORT  PATIENT: Sharon Hughes, Sharon H.  MR#: [id number] BIRTHDATE: May 19, 1966  GENDER: Female ENDOSCOPIST: Gaynelle AduEric [name], MD ASSISTANT:   Nilsa NuttingFelicia Akande, Endo Technician and Jimmey RalphSusan Kincaid, RN, CGRN PROCEDURE DATE: 12/01/2013 PROCEDURE:   EGD, diagnostic ASA CLASS:   Class II INDICATIONS:s/p lap roux en y gastric bypass 08/2013 with bilateral upper abd pain with solid food intake followed by emesis. MEDICATIONS: See Anesthesia Report and MAC sedation, administered by CRNA TOPICAL ANESTHETIC:   Cetacaine Spray  DESCRIPTION OF PROCEDURE:   After the risks benefits and alternatives of the procedure were thoroughly explained, informed consent was obtained.  The PENTAX GASTOROSCOPE [id number]  endoscope was introduced through the mouth  and advanced to the anastomosis Without limitations.      The instrument was slowly withdrawn as the mucosa was fully examined.    The scope was advanced down the esophagus into the pouch.  GE junction at 41cm.  Gastro-Jejunal Anastomosis at 45.  pouch appeared to be of normal size and caliber.  The anastomosis was readily idenitified.  There was no evidence of ulcers in the pouch or at the anastomosis.  A few staples at the anastomosis were visible.  It was patent.  There was an ellipse of roux limb mucosa at the anatomosis that was filling a part of the anatomosis. However I was able to easily advance the scope thru the anastomosis into the roux limb which appeared normal without any evidence of ulcers.  Multiple pictures were taken.          The scope was then withdrawn from the patient and the procedure terminated.  COMPLICATIONS: There were no complications. IMPRESSION:Normal postoperative gastric bypass anatomy; No evidence of ulcers; No evidence of significant stricture; some hypertrophied roux limb mucosa at anastomosis of unclear  significance.  RECOMMENDATIONS:continue daily PPI; keep food journal to track symptoms; practice bariatric eating techniques; follow up in office as scheduled   _______________________________ eSigned:  Gaynelle AduEric [name], MD 12/01/2013 9:19 AM

## 2013-12-01 NOTE — Transfer of Care (Signed)
Immediate Anesthesia Transfer of Care Note  Patient: Sharon Hughes  Procedure(s) Performed: Procedure(s): ESOPHAGOGASTRODUODENOSCOPY (EGD) (N/A)  Patient Location: PACU  Anesthesia Type:MAC  Level of Consciousness: awake, sedated and patient cooperative  Airway & Oxygen Therapy: Patient Spontanous Breathing and Patient connected to nasal cannula oxygen  Post-op Assessment: Report given to PACU RN and Post -op Vital signs reviewed and stable  Post vital signs: Reviewed and stable  Complications: No apparent anesthesia complications

## 2013-12-01 NOTE — Anesthesia Postprocedure Evaluation (Signed)
  Anesthesia Post-op Note  Patient: Sharon Hughes  Procedure(s) Performed: Procedure(s) (LRB): ESOPHAGOGASTRODUODENOSCOPY (EGD) (N/A)  Patient Location: PACU  Anesthesia Type: MAC  Level of Consciousness: awake and alert   Airway and Oxygen Therapy: Patient Spontanous Breathing  Post-op Pain: mild  Post-op Assessment: Post-op Vital signs reviewed, Patient's Cardiovascular Status Stable, Respiratory Function Stable, Patent Airway and No signs of Nausea or vomiting  Last Vitals:  Filed Vitals:   12/01/13 0845  BP: 130/73  Temp:   Resp: 14    Post-op Vital Signs: stable   Complications: No apparent anesthesia complications

## 2013-12-01 NOTE — Discharge Instructions (Addendum)
Continue taking reflux medication Keep a food journal  Gastrointestinal Endoscopy Care After Refer to this sheet in the next few weeks. These instructions provide you with information on caring for yourself after your procedure. Your caregiver may also give you more specific instructions. Your treatment has been planned according to current medical practices, but problems sometimes occur. Call your caregiver if you have any problems or questions after your procedure. HOME CARE INSTRUCTIONS  If you were given medicine to help you relax (sedative), do not drive, operate machinery, or sign important documents for 24 hours.  Avoid alcohol and hot or warm beverages for the first 24 hours after the procedure.  Only take over-the-counter or prescription medicines for pain, discomfort, or fever as directed by your caregiver. You may resume taking your normal medicines unless your caregiver tells you otherwise. Ask your caregiver when you may resume taking medicines that may cause bleeding, such as aspirin, clopidogrel, or warfarin.  You may return to your normal diet and activities on the day after your procedure, or as directed by your caregiver. Walking may help to reduce any bloated feeling in your abdomen.  Drink enough fluids to keep your urine clear or pale yellow.  You may gargle with salt water if you have a sore throat. SEEK IMMEDIATE MEDICAL CARE IF:  You have severe nausea or vomiting.  You have severe abdominal pain, abdominal cramps that last longer than 6 hours, or abdominal swelling (distention).  You have severe shoulder or back pain.  You have trouble swallowing.  You have shortness of breath, your breathing is shallow, or you are breathing faster than normal.  You have a fever or a rapid heartbeat.  You vomit blood or material that looks like coffee grounds.  You have bloody, black, or tarry stools. MAKE SURE YOU:  Understand these instructions.  Will watch your  condition.  Will get help right away if you are not doing well or get worse. Document Released: 03/17/2004 Document Revised: 02/02/2012 Document Reviewed: 11/03/2011 [facility] Patient Information 2014 [employer] [place].

## 2013-12-01 NOTE — Anesthesia Preprocedure Evaluation (Addendum)
Anesthesia Evaluation  Patient identified by MRN, date of birth, ID band Patient awake    Reviewed: Allergy & Precautions, H&P , NPO status , Patient's Chart, lab work & pertinent test results  History of Anesthesia Complications (+) PONV  Airway Mallampati: II TM Distance: >3 FB Neck ROM: Full    Dental no notable dental hx.    Pulmonary asthma , former smoker,  breath sounds clear to auscultation  Pulmonary exam normal       Cardiovascular hypertension, Rhythm:Regular Rate:Normal     Neuro/Psych negative neurological ROS  negative psych ROS   GI/Hepatic negative GI ROS, Neg liver ROS,   Endo/Other  negative endocrine ROS  Renal/GU negative Renal ROS  negative genitourinary   Musculoskeletal negative musculoskeletal ROS (+)   Abdominal   Peds negative pediatric ROS (+)  Hematology negative hematology ROS (+)   Anesthesia Other Findings   Reproductive/Obstetrics negative OB ROS                          Anesthesia Physical Anesthesia Plan  ASA: II  Anesthesia Plan: MAC   Post-op Pain Management:    Induction:   Airway Management Planned: Natural Airway  Additional Equipment:   Intra-op Plan:   Post-operative Plan:   Informed Consent: I have reviewed the patients History and Physical, chart, labs and discussed the procedure including the risks, benefits and alternatives for the proposed anesthesia with the patient or authorized representative who has indicated his/her understanding and acceptance.   Dental advisory given  Plan Discussed with: CRNA  Anesthesia Plan Comments:         Anesthesia Quick Evaluation

## 2013-12-04 ENCOUNTER — Encounter (HOSPITAL_COMMUNITY): Payer: Self-pay | Admitting: General Surgery

## 2013-12-11 ENCOUNTER — Other Ambulatory Visit (INDEPENDENT_AMBULATORY_CARE_PROVIDER_SITE_OTHER): Payer: Self-pay

## 2013-12-11 ENCOUNTER — Telehealth (INDEPENDENT_AMBULATORY_CARE_PROVIDER_SITE_OTHER): Payer: Self-pay

## 2013-12-11 DIAGNOSIS — R112 Nausea with vomiting, unspecified: Secondary | ICD-10-CM

## 2013-12-11 DIAGNOSIS — Z9889 Other specified postprocedural states: Principal | ICD-10-CM

## 2013-12-11 NOTE — Telephone Encounter (Signed)
Called and left message for patient to make aware that [facility]

## 2013-12-11 NOTE — Telephone Encounter (Signed)
Patient is asking for refill of Zofran 4mg  rx to be called to [facility]  [phone]

## 2013-12-12 ENCOUNTER — Other Ambulatory Visit (INDEPENDENT_AMBULATORY_CARE_PROVIDER_SITE_OTHER): Payer: Self-pay

## 2013-12-12 MED ORDER — ONDANSETRON HCL 4 MG PO TABS: 4.0000 mg | ORAL_TABLET | Freq: Three times a day (TID) | ORAL | Status: DC | PRN

## 2013-12-12 NOTE — Telephone Encounter (Signed)
Pt calling to report [facility] does not have her Rx.  Zofran 4mg  #30 w/ no refills by Dr. Johna SheriffHoxworth [facility] [place] [address]  Pt is aware.

## 2013-12-18 ENCOUNTER — Telehealth (INDEPENDENT_AMBULATORY_CARE_PROVIDER_SITE_OTHER): Payer: Self-pay | Admitting: General Surgery

## 2013-12-18 ENCOUNTER — Other Ambulatory Visit (INDEPENDENT_AMBULATORY_CARE_PROVIDER_SITE_OTHER): Payer: Self-pay | Admitting: General Surgery

## 2013-12-18 DIAGNOSIS — R112 Nausea with vomiting, unspecified: Secondary | ICD-10-CM

## 2013-12-18 MED ORDER — ONDANSETRON HCL 4 MG PO TABS: 4.0000 mg | ORAL_TABLET | Freq: Three times a day (TID) | ORAL | Status: AC | PRN

## 2013-12-18 NOTE — Telephone Encounter (Signed)
Sent [facility]fran 4 mg Rx though the computer [facility] [phone]

## 2013-12-18 NOTE — Telephone Encounter (Signed)
zofran Refill per protocol

## 2014-01-04 ENCOUNTER — Ambulatory Visit (INDEPENDENT_AMBULATORY_CARE_PROVIDER_SITE_OTHER): Payer: 59 | Admitting: General Surgery

## 2014-01-26 ENCOUNTER — Telehealth (INDEPENDENT_AMBULATORY_CARE_PROVIDER_SITE_OTHER): Payer: Self-pay

## 2014-01-26 NOTE — Telephone Encounter (Signed)
Pt s/p RNY 08/29/13. Pt was calling to see if she can take Ibuprofen or Aleve. Spoke with DinubaGlenda, she states they can take them as long as crushed.  Informed pt that she will be able to take either of these however to she needs to make sure that she crushes them. Pt verbalized understanding and agrees with POC.

## 2014-08-20 IMAGING — RF DG MYELOGRAM LUMBAR
13 of 17 series · 13 of 17 positions shown · non-contrast
Comparison: Lumbar spine radiographs 12/17/2011.

CLINICAL DATA: Persistent low back pain following lumbar spine
surgery. New right thigh pain with electrical type shocks.

EXAM:
LUMBAR MYELOGRAM
TECHNIQUE: Contiguous axial images were obtained through the lumbar spine
without infusion. Coronal, sagittal, and disc space reconstructions
were obtained of the axial image sets.

[Series 2: (hospital) · 1 of 1 slices shown]
[im 1/1]
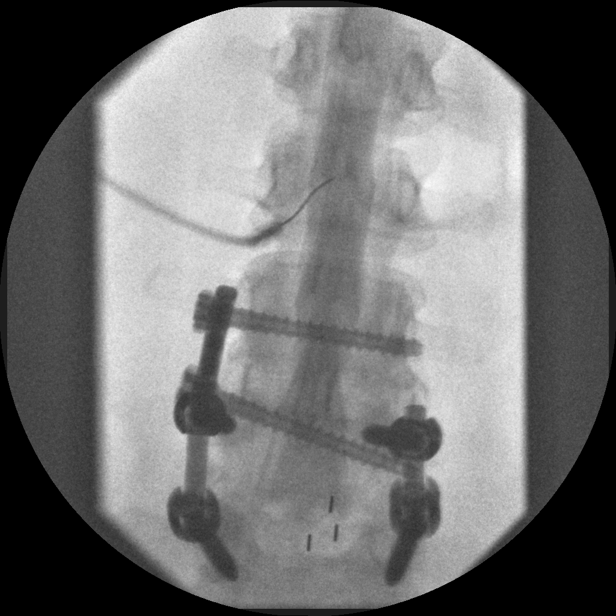

[Series 3: myelogram  white · 1 of 1 slices shown (1 of 10)]
[im 1/1]
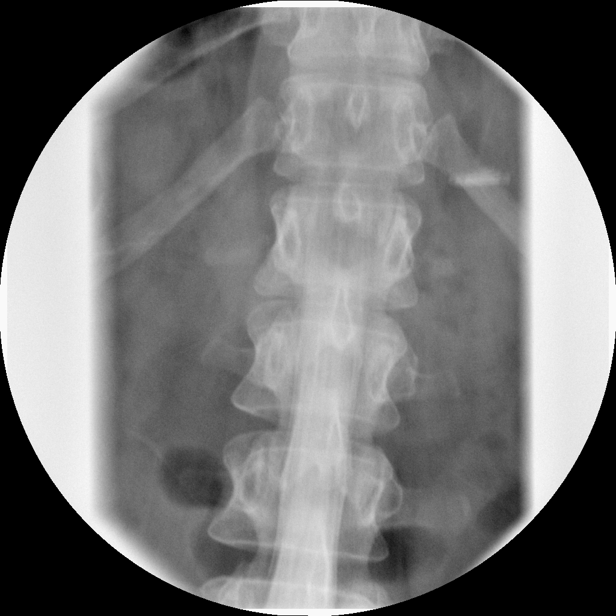

[Series 5: myelogram  white · 1 of 1 slices shown (2 of 10)]
[im 1/1]
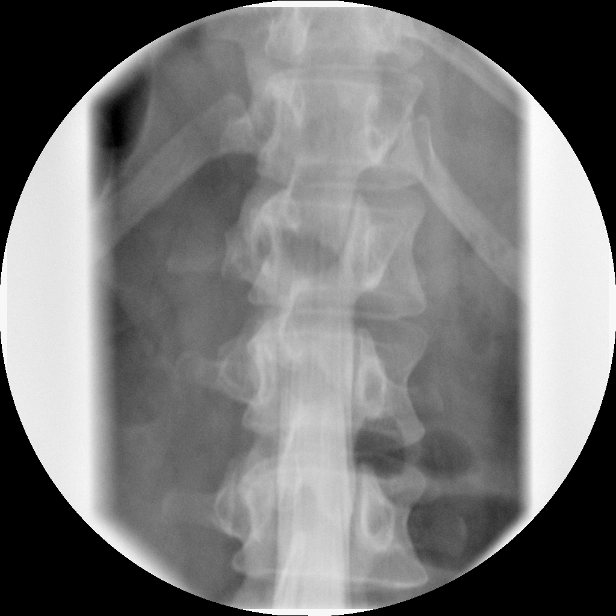

[Series 6: myelogram  white · 1 of 1 slices shown (3 of 10)]
[im 1/1]
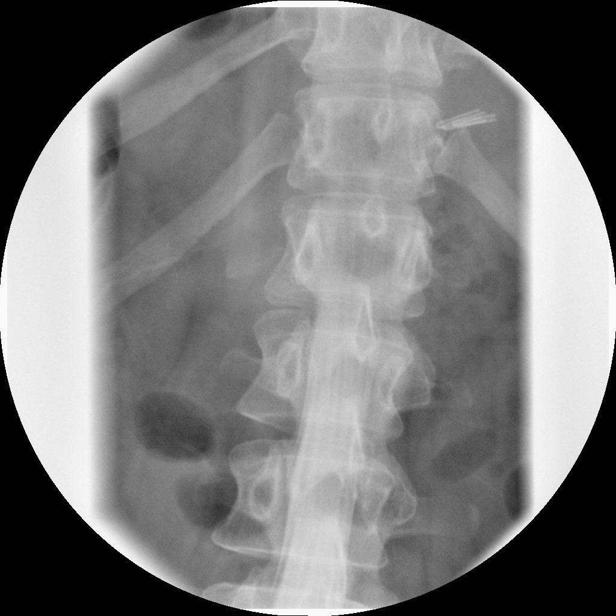

[Series 7: myelogram  white · 1 of 1 slices shown (4 of 10)]
[im 1/1]
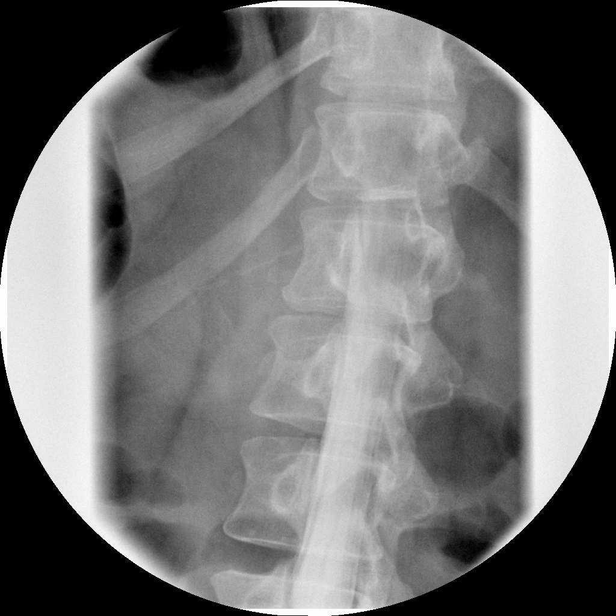

[Series 9: myelogram  white · 1 of 1 slices shown (5 of 10)]
[im 1/1]
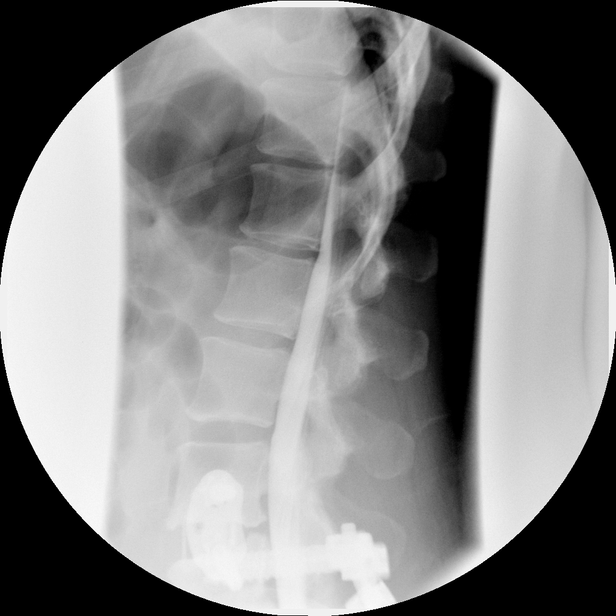

[Series 10: myelogram  white · 1 of 1 slices shown (6 of 10)]
[im 1/1]
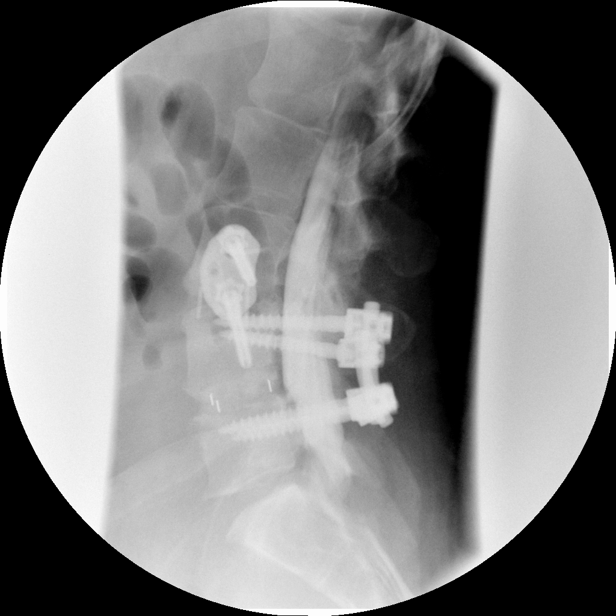

[Series 11: myelogram  white · 1 of 1 slices shown (7 of 10)]
[im 1/1]
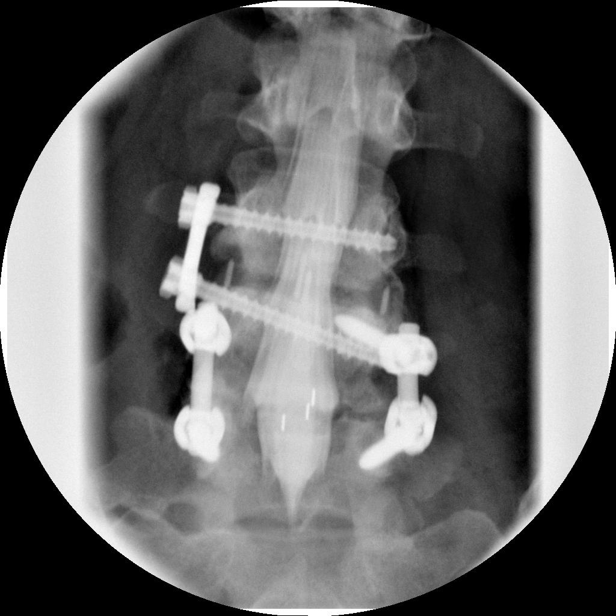

[Series 13: myelogram  white · 1 of 1 slices shown (8 of 10)]
[im 1/1]
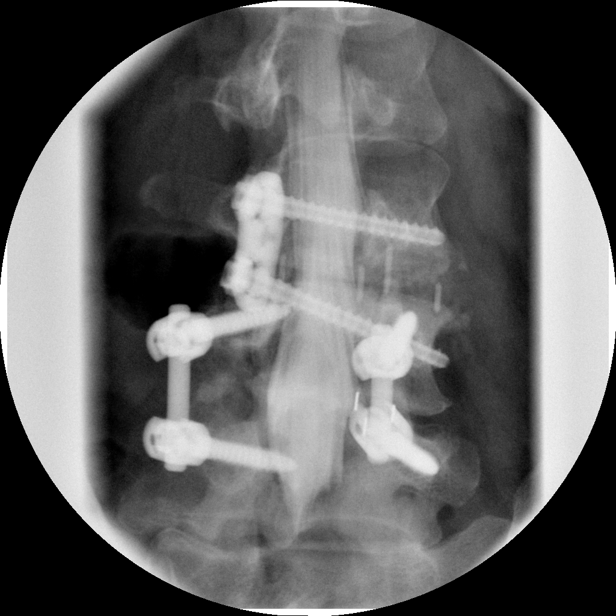

[Series 14: myelogram  white · 1 of 1 slices shown (9 of 10)]
[im 1/1]
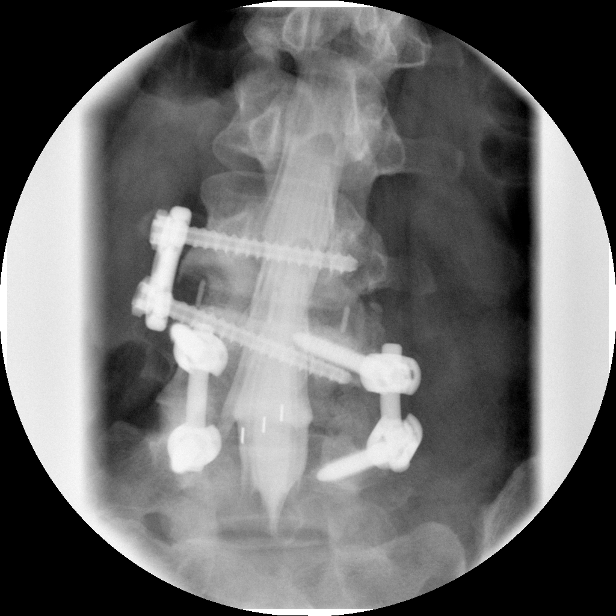

[Series 15: myelogram  white · 1 of 1 slices shown (10 of 10)]
[im 1/1]
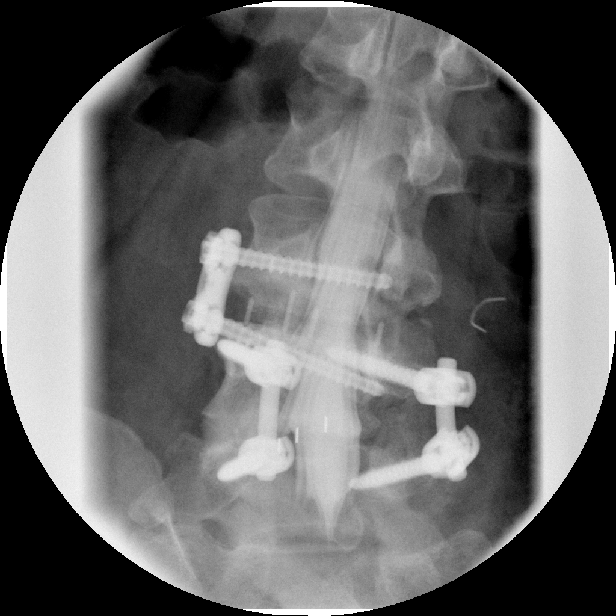

[Series 1002: view not recorded · 0.20mm/px · 1 of 1 slices shown (1 of 2)]
[im 1/1]
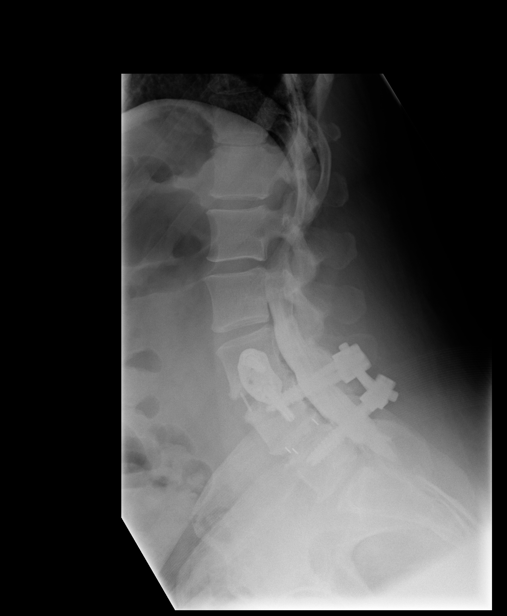

[Series 1003: view not recorded · 0.20mm/px · 1 of 1 slices shown (2 of 2)]
[im 1/1]
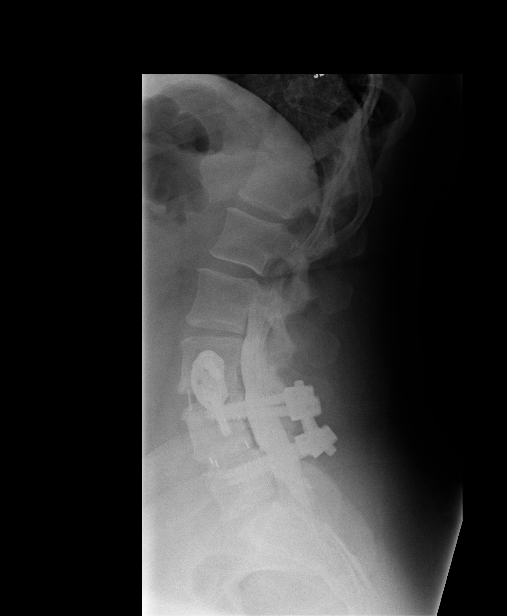

[13 of 17 positions shown; findings below may reference images not displayed]

MRI of the lumbar
spine 09/14/2011 [facility].

FLUOROSCOPY TIME:  55 seconds

PROCEDURE:
After thorough discussion of risks and benefits of the procedure
including bleeding, infection, injury to nerves, blood vessels,
adjacent structures as well as headache and CSF leak, written and
oral informed consent was obtained. Consent was obtained by Dr.
Chai Tiger. Time out form was completed.

Patient was positioned prone on the fluoroscopy table. Local
anesthesia was provided with 1% lidocaine without epinephrine after
prepped and draped in the usual sterile fashion. Puncture was
performed at L1-2 using a 3 1/2 inch 22-gauge spinal needle via left
paramedian approach. Using a single pass through the dura, the
needle was placed within the thecal sac, with return of clear CSF.
15 mL of Lmnipaque-UHW was injected into the thecal sac, with normal
opacification of the nerve roots and cauda equina consistent with
free flow within the subarachnoid space.

I personally performed the lumbar puncture and administered the
intrathecal contrast. I also personally supervised acquisition of
the myelogram images.
FINDINGS: LUMBAR MYELOGRAM FINDINGS:

Transitional anatomy is present. The lowest fully formed lumbar
vertebral bodies labeled L5 as on previous exams. The patient is
status post remote PLIF at L4-5. Patient is now status post left
ALIF with plate instrumentation. A a transitional T12 segment is
present with small ribs. Eleven fully formed ribs are evident on a
previous chest x-ray.

Using this numbering system, the patient is fused at L3-4 and L4-5.
The lower lumbar nerve roots fill normally on both sides. No
significant disc herniation or stenosis is present.

The upright images demonstrate no significant disc herniations.
There is no abnormal motion 3 limited range of flexion and
extension.

CT LUMBAR MYELOGRAM FINDINGS:

Using the same numbering system, the lumbar spine is imaged from the
midbody of T12 through S2-3. Patient is status post PLIF at L4-5. A
with is evident at L3-4 solid fusion is evident at both levels. The
hardware is intact.

T12-L1: Negative.

L1-2: Mild facet hypertrophy is present without significant
stenosis.

L2-3: Mild leftward disc bulging is present. There is no significant
stenosis.

L3-4: The patient is fused. A left foraminotomy was performed. There
is no residual or recurrent stenosis.

L4-5: The patient is fused. No residual stenosis is evident.

L5-S1: Prominent epidural fat narrows the thecal canal
significantly. There is no significant disc herniation or focal
compressive stenosis.
IMPRESSION: LUMBAR MYELOGRAM IMPRESSION:

1. Status post fusion at L3-4 and L4-5 without evidence for residual
or recurrent stenosis.
2. No significant stenosis at the remaining levels.
3. Transitional anatomy at T12 and L5.

CT LUMBAR MYELOGRAM IMPRESSION:

1. Multilevel spondylosis of the lumbar spine without significant
stenosis above the fused levels.
2. Status post fusion at L3-4 and L4-5 as detailed above.
3. Status post left foraminotomy without residual or recurrent
stenosis.
4. Prominent epidural lipomatosis at L5-S1 does displace the nerve
roots.

## 2015-06-21 ENCOUNTER — Telehealth (HOSPITAL_COMMUNITY): Payer: Self-pay

## 2015-06-21 NOTE — Telephone Encounter (Signed)
This patient is overdue for recommended follow-[facility]. A mailing was sent to the patient on 04/12/15 along with a patient survey from both [facility] & [facility] with no response yet at this time. Per message in [facility] at [facility], patient was called on 04/12/15 in attempt to reestablish post-op care, but patient refused appointment advising she follows up with her PCP once a year and did not feel the need to be seen by surgeon at [facility]. In addition, patient commented that she would contact [facility] if she felt the need to be seen. Dr. Andrey CampanileWilson requested that the staff get a copy of her outside records for monitoring labs drawn for purposes of RNY.   [facility] Coordinator [phone]

## 2016-10-09 ENCOUNTER — Other Ambulatory Visit: Payer: Self-pay | Admitting: Neurosurgery

## 2016-10-09 DIAGNOSIS — M5416 Radiculopathy, lumbar region: Secondary | ICD-10-CM

## 2016-11-02 ENCOUNTER — Ambulatory Visit
Admission: RE | Admit: 2016-11-02 | Discharge: 2016-11-02 | Disposition: A | Discharge status: Home / Self Care | Payer: 59 | Source: Ambulatory Visit | Attending: Neurosurgery | Admitting: Neurosurgery

## 2016-11-02 DIAGNOSIS — M5416 Radiculopathy, lumbar region: Secondary | ICD-10-CM

## 2016-11-02 MED ORDER — GADOBENATE DIMEGLUMINE 529 MG/ML IV SOLN
20.0000 mL | Freq: Once | INTRAVENOUS | Status: AC | PRN
  Administered 2016-11-02: 20 mL via INTRAVENOUS

## 2017-01-19 ENCOUNTER — Ambulatory Visit (HOSPITAL_COMMUNITY): Payer: 59 | Admitting: Licensed Clinical Social Worker

## 2017-03-25 ENCOUNTER — Encounter (HOSPITAL_COMMUNITY): Payer: Self-pay
